# Patient Record
Sex: Female | Born: 1965 | Race: Black or African American | Hispanic: No | Marital: Single | Smoking: Never smoker
Health system: Southern US, Community
[De-identification: ages and names within clinical notes are randomized; demographics above are authoritative.]

---

## 1999-07-31 ENCOUNTER — Emergency Department (HOSPITAL_COMMUNITY): Admission: EM | Admit: 1999-07-31 | Discharge: 1999-07-31 | Payer: Self-pay | Admitting: Emergency Medicine

## 2002-01-08 ENCOUNTER — Emergency Department (HOSPITAL_COMMUNITY): Admission: EM | Admit: 2002-01-08 | Discharge: 2002-01-08 | Payer: Self-pay | Admitting: Emergency Medicine

## 2002-01-08 ENCOUNTER — Encounter: Payer: Self-pay | Admitting: Emergency Medicine

## 2002-03-15 ENCOUNTER — Emergency Department (HOSPITAL_COMMUNITY): Admission: EM | Admit: 2002-03-15 | Discharge: 2002-03-15 | Payer: Self-pay | Admitting: Emergency Medicine

## 2003-04-30 ENCOUNTER — Emergency Department (HOSPITAL_COMMUNITY): Admission: EM | Admit: 2003-04-30 | Discharge: 2003-04-30 | Payer: Self-pay | Admitting: Emergency Medicine

## 2013-11-17 ENCOUNTER — Emergency Department (HOSPITAL_COMMUNITY)
Admission: EM | Admit: 2013-11-17 | Discharge: 2013-11-17 | Disposition: A | Discharge status: Home / Self Care | Payer: Self-pay | Attending: Emergency Medicine | Admitting: Emergency Medicine

## 2013-11-17 ENCOUNTER — Encounter (HOSPITAL_COMMUNITY): Payer: Self-pay | Admitting: Emergency Medicine

## 2013-11-17 DIAGNOSIS — S39012A Strain of muscle, fascia and tendon of lower back, initial encounter: Secondary | ICD-10-CM

## 2013-11-17 DIAGNOSIS — M659 Synovitis and tenosynovitis, unspecified: Secondary | ICD-10-CM | POA: Insufficient documentation

## 2013-11-17 DIAGNOSIS — M7750 Other enthesopathy of unspecified foot: Secondary | ICD-10-CM

## 2013-11-17 DIAGNOSIS — Y939 Activity, unspecified: Secondary | ICD-10-CM | POA: Insufficient documentation

## 2013-11-17 DIAGNOSIS — S335XXA Sprain of ligaments of lumbar spine, initial encounter: Secondary | ICD-10-CM | POA: Insufficient documentation

## 2013-11-17 DIAGNOSIS — X58XXXA Exposure to other specified factors, initial encounter: Secondary | ICD-10-CM | POA: Insufficient documentation

## 2013-11-17 DIAGNOSIS — M65979 Unspecified synovitis and tenosynovitis, unspecified ankle and foot: Secondary | ICD-10-CM | POA: Insufficient documentation

## 2013-11-17 DIAGNOSIS — Y929 Unspecified place or not applicable: Secondary | ICD-10-CM | POA: Insufficient documentation

## 2013-11-17 MED ORDER — HYDROCODONE-ACETAMINOPHEN 5-325 MG PO TABS: 1.0000 | ORAL_TABLET | ORAL | Status: DC | PRN

## 2013-11-17 MED ORDER — BACLOFEN 10 MG PO TABS: ORAL_TABLET | ORAL | Status: DC

## 2013-11-17 MED ORDER — MELOXICAM 7.5 MG PO TABS: ORAL_TABLET | ORAL | Status: DC

## 2013-11-17 NOTE — ED Provider Notes (Signed)
CSN: [id number]     Arrival date & time 11/17/13  1740 History   First MD Initiated Contact with Patient 11/17/13 1812     Chief Complaint  Patient presents with  . Foot Pain  . Back Pain   (Consider location/radiation/quality/duration/timing/severity/associated sxs/prior Treatment) HPI Comments: The patient is a 47 year old female who presents to the emergency department with complaint of pain at the" top of the left foot" and pain of the right lower back.  The patient states that she has had problems with her foot for a little over 2 months. Within the last week and she is having more and more pain involving the top of her left foot. The patient states that when she first awakens in the morning there is no pain but as she walks around at her job she has more and more pain. By the in the day she states that she can't stand to put weight or flex her left foot. He's not had any recent injury or trauma to the left foot. His been no previous operations or procedures involving the left foot. The patient states she does a lot of walking and standing and she works in [employer] . She has tried changing shoes but this has not helped. She denies any numbness or tingling of the toes, and been no changes in temperature or or color of the left foot.  The patient also complains of soreness of her right lower back. She states that this is worse when she lays in certain positions or makes certain movements. She has not had any urinary symptoms. She has no history of kidney stones. There's been no injury or trauma to the right flank area.  Patient is a 47 y.o. female presenting with lower extremity pain and back pain. The history is provided by the patient.  Foot Pain Associated symptoms include arthralgias. Pertinent negatives include no abdominal pain, chest pain, coughing or neck pain.  Back Pain Associated symptoms: no abdominal pain, no chest pain and no dysuria     History reviewed. No pertinent past  medical history. History reviewed. No pertinent past surgical history. No family history on file. History  Substance Use Topics  . Smoking status: Never Smoker   . Smokeless tobacco: Not on file  . Alcohol Use: No   OB History   Grav Para Term Preterm Abortions TAB SAB Ect Mult Living                 Review of Systems  Constitutional: Negative for activity change.       All ROS Neg except as noted in HPI  HENT: Negative for nosebleeds.   Eyes: Negative for photophobia and discharge.  Respiratory: Negative for cough, shortness of breath and wheezing.   Cardiovascular: Negative for chest pain and palpitations.  Gastrointestinal: Negative for abdominal pain and blood in stool.  Genitourinary: Negative for dysuria, frequency and hematuria.  Musculoskeletal: Positive for arthralgias and back pain. Negative for neck pain.       Foot pain  Skin: Negative.   Neurological: Negative for dizziness, seizures and speech difficulty.  Psychiatric/Behavioral: Negative for hallucinations and confusion.    Allergies  Review of patient's allergies indicates no known allergies.  Home Medications  No current outpatient prescriptions on file. BP 143/88  Pulse 89  Temp(Src) 98.3 F (36.8 C) (Oral)  Resp 18  Ht 5\' 6"  (1.676 m)  Wt 172 lb (78.019 kg)  BMI 27.77 kg/m2  SpO2 96%  LMP 10/13/2013 Physical  Exam  Nursing note and vitals reviewed. Constitutional: She is oriented to person, place, and time. She appears well-developed and well-nourished.  Non-toxic appearance.  HENT:  Head: Normocephalic.  Right Ear: Tympanic membrane and external ear normal.  Left Ear: Tympanic membrane and external ear normal.  Eyes: EOM and lids are normal. Pupils are equal, round, and reactive to light.  Neck: Normal range of motion. Neck supple. Carotid bruit is not present.  Cardiovascular: Normal rate, regular rhythm, normal heart sounds, intact distal pulses and normal pulses.   Pulmonary/Chest: Breath  sounds normal. No respiratory distress.  Abdominal: Soft. Bowel sounds are normal. There is no tenderness. There is no guarding.  Musculoskeletal: Normal range of motion.  There is pain at the dorsum of the left foot with flexion of the toes. Palpation. There is no pain with flexion and extension of the ankle. The Achilles tendon is intact. The capillary refill is less than 2 seconds. There no lesions noted between the toes.  There is soreness with attempted range of motion of the lower back, right more than left. There are no hot areas appreciated. No palpable step off of the lumbar area.  Lymphadenopathy:       Head (right side): No submandibular adenopathy present.       Head (left side): No submandibular adenopathy present.    She has no cervical adenopathy.  Neurological: She is alert and oriented to person, place, and time. She has normal strength. No cranial nerve deficit or sensory deficit.  Skin: Skin is warm and dry.  Psychiatric: She has a normal mood and affect. Her speech is normal.    ED Course  Procedures (including critical care time) Labs Review Labs Reviewed - No data to display Imaging Review No results found.  EKG Interpretation   None       MDM  No diagnosis found. *I have reviewed nursing notes, vital signs, and all appropriate lab and imaging results for this patient.**  Examination is consistent with tendinitis involving the dorsum of the left foot. There is also mild to moderate muscle strain involving the lower back. The vital signs are well within normal limits. The pulse oximetry is 96% on room air. Within normal limits by my interpretation. There is no history of urinary symptoms, and no history of kidney stones.  The plan at this time is for the patient to be treated with Mobic and Robaxin . Patient is to see orthopedics concerning her foot pain if not improving.  Kathie Dike, PA-C 11/18/13 0003

## 2013-11-17 NOTE — ED Notes (Signed)
Pt c/o pain in top of left foot and r lower back x 2 months.  Reports pain got worse yesterday.  Denies injury.  Reports her job requires a lot of standing.

## 2013-11-18 NOTE — ED Provider Notes (Signed)
Medical screening examination/treatment/procedure(s) were performed by non-physician practitioner and as supervising physician I was immediately available for consultation/collaboration.  EKG Interpretation   None         Glynn Octave, MD 11/18/13 [phone]

## 2014-07-09 ENCOUNTER — Emergency Department (HOSPITAL_COMMUNITY): Payer: Self-pay

## 2014-07-09 ENCOUNTER — Emergency Department (HOSPITAL_COMMUNITY)
Admission: EM | Admit: 2014-07-09 | Discharge: 2014-07-09 | Disposition: A | Discharge status: Home / Self Care | Payer: Self-pay | Attending: Emergency Medicine | Admitting: Emergency Medicine

## 2014-07-09 ENCOUNTER — Encounter (HOSPITAL_COMMUNITY): Payer: Self-pay | Admitting: Emergency Medicine

## 2014-07-09 DIAGNOSIS — Y929 Unspecified place or not applicable: Secondary | ICD-10-CM | POA: Insufficient documentation

## 2014-07-09 DIAGNOSIS — Z79899 Other long term (current) drug therapy: Secondary | ICD-10-CM | POA: Insufficient documentation

## 2014-07-09 DIAGNOSIS — Y9389 Activity, other specified: Secondary | ICD-10-CM | POA: Insufficient documentation

## 2014-07-09 DIAGNOSIS — S61012A Laceration without foreign body of left thumb without damage to nail, initial encounter: Secondary | ICD-10-CM

## 2014-07-09 DIAGNOSIS — Z23 Encounter for immunization: Secondary | ICD-10-CM | POA: Insufficient documentation

## 2014-07-09 DIAGNOSIS — S61209A Unspecified open wound of unspecified finger without damage to nail, initial encounter: Secondary | ICD-10-CM | POA: Insufficient documentation

## 2014-07-09 DIAGNOSIS — W260XXA Contact with knife, initial encounter: Secondary | ICD-10-CM | POA: Insufficient documentation

## 2014-07-09 DIAGNOSIS — W261XXA Contact with sword or dagger, initial encounter: Secondary | ICD-10-CM

## 2014-07-09 MED ORDER — TETANUS-DIPHTH-ACELL PERTUSSIS 5-2.5-18.5 LF-MCG/0.5 IM SUSP
0.5000 mL | Freq: Once | INTRAMUSCULAR | Status: AC
  Administered 2014-07-09: 0.5 mL via INTRAMUSCULAR
  Filled 2014-07-09: qty 0.5

## 2014-07-09 MED ORDER — BACITRACIN-NEOMYCIN-POLYMYXIN 400-5-5000 EX OINT
TOPICAL_OINTMENT | Freq: Once | CUTANEOUS | Status: AC
  Administered 2014-07-09: 1 via TOPICAL
  Filled 2014-07-09: qty 1

## 2014-07-09 MED ORDER — BUPIVACAINE HCL (PF) 0.25 % IJ SOLN
30.0000 mL | Freq: Once | INTRAMUSCULAR | Status: AC
  Administered 2014-07-09: 30 mL
  Filled 2014-07-09: qty 30

## 2014-07-09 MED ORDER — LIDOCAINE HCL (PF) 1 % IJ SOLN
30.0000 mL | Freq: Once | INTRAMUSCULAR | Status: DC
  Filled 2014-07-09: qty 30

## 2014-07-09 MED ORDER — LIDOCAINE HCL (PF) 1 % IJ SOLN
5.0000 mL | Freq: Once | INTRAMUSCULAR | Status: AC
  Administered 2014-07-09: 5 mL

## 2014-07-09 MED ORDER — POVIDONE-IODINE 10 % EX SOLN
CUTANEOUS | Status: AC
  Filled 2014-07-09: qty 118

## 2014-07-09 MED ORDER — LIDOCAINE HCL (PF) 1 % IJ SOLN
INTRAMUSCULAR | Status: AC
  Administered 2014-07-09: 5 mL
  Filled 2014-07-09: qty 5

## 2014-07-09 NOTE — ED Notes (Signed)
Pt c/o laceration to left thumb, pt states that she was cutting up sausage this am with a knife and cut her left thumb instead, pt has laceration noted to pad of left thumb area, bleeding controlled at present,

## 2014-07-09 NOTE — ED Provider Notes (Signed)
CSN: [id number]     Arrival date & time 07/09/14  0911 History   First MD Initiated Contact with Patient 07/09/14 0913     Chief Complaint  Patient presents with  . Laceration     (Consider location/radiation/quality/duration/timing/severity/associated sxs/prior Treatment) Patient is a 48 y.o. female presenting with skin laceration. The history is provided by the patient.  Laceration Location:  Finger Finger laceration location:  L thumb Depth:  Through dermis Bleeding: controlled with pressure   Time since incident:  1 hour Laceration mechanism:  Knife Pain details:    Severity:  Mild   Timing:  Constant   Progression:  Partially resolved Foreign body present:  No foreign bodies Relieved by:  Pressure Tetanus status:  Unknown  Kathryn Fletcher is a 48 y.o. female who presents to the ED with a laceration to the left thumb. She states she was cutting up frozen sausage this morning at work and the knife slipped and cut her thumb. The laceration is at the tip and goes through the nail. She denies any other injuries. Bleeding controlled at this time.   History reviewed. No pertinent past medical history. History reviewed. No pertinent past surgical history. No family history on file. History  Substance Use Topics  . Smoking status: Never Smoker   . Smokeless tobacco: Not on file  . Alcohol Use: No   OB History   Grav Para Term Preterm Abortions TAB SAB Ect Mult Living                 Review of Systems    Allergies  Review of patient's allergies indicates no known allergies.  Home Medications   Prior to Admission medications   Medication Sig Start Date End Date Taking? Authorizing Provider  baclofen (LIORESAL) 10 MG tablet 1 po tid 11/17/13   Kathie Dike, PA-C  HYDROcodone-acetaminophen (NORCO/VICODIN) 5-325 MG per tablet Take 1 tablet by mouth every 4 (four) hours as needed. 11/17/13   Kathie Dike, PA-C  meloxicam (MOBIC) 7.5 MG tablet 1 po bid with food  11/17/13   Kathie Dike, PA-C   BP 150/90  Pulse 82  Temp(Src) 98.2 F (36.8 C) (Oral)  Resp 16  Ht 5\' 6"  (1.676 m)  Wt 172 lb (78.019 kg)  BMI 27.77 kg/m2  SpO2 100%  LMP 07/03/2014 Physical Exam  Nursing note and vitals reviewed. Constitutional: She is oriented to person, place, and time. She appears well-developed and well-nourished. No distress.  HENT:  Head: Normocephalic.  Eyes: EOM are normal.  Neck: Neck supple.  Cardiovascular: Normal rate.   Pulmonary/Chest: Effort normal.  Musculoskeletal: Normal range of motion.       Left hand: She exhibits tenderness and laceration. She exhibits normal range of motion, normal capillary refill and no swelling. Normal sensation noted. Normal strength noted.       Hands: There is a flap laceration to the palmar aspect of the distal aspect of the right thumb. The laceration extends through the distal portion of the nail but does not extend to the nail bed.   Neurological: She is alert and oriented to person, place, and time. No cranial nerve deficit.  Skin: Skin is warm and dry.  Psychiatric: She has a normal mood and affect. Her behavior is normal.   Dg Finger Thumb Left  07/09/2014   CLINICAL DATA:  Laceration to distal aspect of left first finger.  EXAM: LEFT THUMB 2+V  COMPARISON:  None.  FINDINGS: No evidence of acute  fracture, dislocation or soft tissue foreign body. No arthropathy or bony lesions.  IMPRESSION: Normal left first finger.   Electronically Signed   By: Irish LackGlenn  Yamagata M.D.   On: 07/09/2014 10:05    ED Course  Procedures LACERATION REPAIR Performed by: NEESE,HOPE Authorized by: NEESE,HOPE Consent: Verbal consent obtained. Risks and benefits: risks, benefits and alternatives were discussed Consent given by: patient Patient identity confirmed: provided demographic data Prepped and Draped in normal sterile fashion Wound explored  Laceration Location: tip of left thumb, V shaped flap  Laceration Length: 2  cm  No Foreign Bodies seen or palpated  Anesthesia: digital block  Local anesthetic: lidocaine 1% without epinephrine and Sensorcaine 0.25%  Anesthetic total: 4 ml  Irrigation method: syringe Amount of cleaning: standard  Skin closure: 5-0 prolene  Number of sutures: 4 Technique: interrupted  Patient tolerance: Patient tolerated the procedure well with no immediate complications.  MDM  48 y.o. female with laceration to the left thumb that she accidentally cut with a knife at work today. X-ray reviewed,  Tetanus updated, bacitracin ointment and pressure dressing applied. Patient to follow up for suture removal in one week with her doctor or she may return here as needed. She will take tylenol as needed for pain. Stable for discharge without neurovascular deficits.   Hope Orlene OchM Neese, NP 07/09/14 1102  Medical screening examination/treatment/procedure(s) were conducted as a shared visit with non-physician practitioner(s) and myself.  I personally evaluated the patient during the encounter.   EKG Interpretation None     Wound examined. Neurovascular intact. Laceration repair per nurse practitioner  Kathryn HutchingBrian Mertis Mosher, MD 07/10/14 [phone]

## 2014-07-16 ENCOUNTER — Emergency Department (HOSPITAL_COMMUNITY)
Admission: EM | Admit: 2014-07-16 | Discharge: 2014-07-16 | Disposition: A | Discharge status: Home / Self Care | Payer: Self-pay | Attending: Emergency Medicine | Admitting: Emergency Medicine

## 2014-07-16 ENCOUNTER — Encounter (HOSPITAL_COMMUNITY): Payer: Self-pay | Admitting: Emergency Medicine

## 2014-07-16 DIAGNOSIS — Z4802 Encounter for removal of sutures: Secondary | ICD-10-CM | POA: Insufficient documentation

## 2014-07-16 DIAGNOSIS — Z791 Long term (current) use of non-steroidal anti-inflammatories (NSAID): Secondary | ICD-10-CM | POA: Insufficient documentation

## 2014-07-16 NOTE — Discharge Instructions (Signed)
Incision Care ° An incision is a surgical cut to open your skin. You need to take care of your incision. This helps you to not get an infection. °HOME CARE °· Only take medicine as told by your doctor. °· Do not take off your bandage (dressing) or get your incision wet until your doctor approves. Change the bandage and call your doctor if the bandage gets wet, dirty, or starts to smell. °· Take showers. Do not take baths, swim, or do anything that may soak your incision until it heals. °· Return to your normal diet and activities as told or allowed by your doctor. °· Avoid lifting any weight until your doctor approves. °· Put medicine that helps lessen itching on your incision as told by your doctor. Do not pick or scratch at your incision. °· Keep your doctor visit to have your stitches (sutures) or staples removed. °· Drink enough fluids to keep your pee (urine) clear or pale yellow. °GET HELP RIGHT AWAY IF: °· You have a fever. °· You have a rash. °· You are dizzy, or you pass out (faint) while standing. °· You have trouble breathing. °· You have a reaction or side effects to medicine given to you. °· You have redness, puffiness (swelling), or more pain in the incision and medicine does not help. °· You have fluid, blood, or yellowish-white fluid (pus) coming from the incision lasting over 1 day. °· You have muscle aches, chills, or you feel sick. °· You have a bad smell coming from the incision or bandage. °· Your incision opens up after stitches, staples, or sticky strips have been removed. °· You keep feeling sick to your stomach (nauseous) or keep throwing up (vomiting). °MAKE SURE YOU:  °· Understand these instructions. °· Will watch your condition. °· Will get help right away if you are not doing well or get worse. °Document Released: 02/10/2012 Document Reviewed: 01/12/2014 °[employer]® Patient Information ©2015 [employer]. This information is not intended to replace advice given to you by your health  care provider. Make sure you discuss any questions you have with your health care provider. ° ° ° ° °

## 2014-07-16 NOTE — ED Notes (Signed)
Stitches placed last Saturday, back for removal today

## 2014-07-16 NOTE — ED Provider Notes (Signed)
CSN: [id number]     Arrival date & time 07/16/14  2018 History   First MD Initiated Contact with Patient 07/16/14 2025     Chief Complaint  Patient presents with  . Suture / Staple Removal    left tumb     (Consider location/radiation/quality/duration/timing/severity/associated sxs/prior Treatment) HPI  Kathryn Fletcher is a 48 y.o. female presenting for suture removal to left thumb. Patient cut herself with a knife while cutting meat approximately a week ago. She had sutures placed here. Patient denies any pain, discharge, swelling.  History reviewed. No pertinent past medical history. History reviewed. No pertinent past surgical history. History reviewed. No pertinent family history. History  Substance Use Topics  . Smoking status: Never Smoker   . Smokeless tobacco: Not on file  . Alcohol Use: No   OB History   Grav Para Term Preterm Abortions TAB SAB Ect Mult Living                 Review of Systems  10 systems reviewed and found to be negative, except as noted in the HPI.   Allergies  Review of patient's allergies indicates no known allergies.  Home Medications   Prior to Admission medications   Medication Sig Start Date End Date Taking? Authorizing Provider  acetaminophen (TYLENOL) 500 MG tablet Take 1,000 mg by mouth every 6 (six) hours as needed (cramps).    Historical Provider, MD  naproxen sodium (ANAPROX) 220 MG tablet Take 440 mg by mouth 2 (two) times daily with a meal.    Historical Provider, MD   BP 140/80  Pulse 88  Temp(Src) 98.8 F (37.1 C) (Oral)  Resp 20  Ht 5\' 6"  (1.676 m)  Wt 172 lb (78.019 kg)  BMI 27.77 kg/m2  SpO2 100%  LMP 07/03/2014 Physical Exam  Nursing note and vitals reviewed. Constitutional: She is oriented to person, place, and time. She appears well-developed and well-nourished. No distress.  HENT:  Head: Normocephalic.  Eyes: Conjunctivae and EOM are normal.  Cardiovascular: Normal rate.   Pulmonary/Chest: Effort normal.  No stridor.  Musculoskeletal: Normal range of motion.  Neurological: She is alert and oriented to person, place, and time.  Skin:  2 Sutures in place on  pad of left thumb. Clean dry and intact. No tenderness palpation.  Psychiatric: She has a normal mood and affect.    ED Course  SUTURE REMOVAL Date/Time: 07/16/2014 8:34 PM Performed by: Wynetta Emery Authorized by: Wynetta Emery Consent: Verbal consent obtained. Consent given by: patient Patient identity confirmed: verbally with patient Body area: upper extremity Location details: left thumb Wound Appearance: clean Sutures Removed: 2 Facility: sutures placed in this facility Patient tolerance: Patient tolerated the procedure well with no immediate complications.   (including critical care time) Labs Review Labs Reviewed - No data to display  Imaging Review No results found.   EKG Interpretation None      MDM   Final diagnoses:  Visit for suture removal    Filed Vitals:   07/16/14 2023  BP: 140/80  Pulse: 88  Temp: 98.8 F (37.1 C)  TempSrc: Oral  Resp: 20  Height: 5\' 6"  (1.676 m)  Weight: 172 lb (78.019 kg)  SpO2: 100%    Kathryn Fletcher is a 48 y.o. female presenting for suture removal to left thumb. No signs of infection.  Evaluation does not show pathology that would require ongoing emergent intervention or inpatient treatment. Pt is hemodynamically stable and mentating appropriately. Discussed findings and plan  with patient/guardian, who agrees with care plan. All questions answered. Return precautions discussed and outpatient follow up given.    [name], PA-C 07/16/14 2035

## 2014-07-17 NOTE — ED Provider Notes (Signed)
Medical screening examination/treatment/procedure(s) were performed by non-physician practitioner and as supervising physician I was immediately available for consultation/collaboration.    Larita Deremer, MD 07/17/14 0019 

## 2015-04-14 IMAGING — CR DG FINGER THUMB 2+V*L*
1 series · 1 of 1 positions shown · non-contrast
Comparison: None.

CLINICAL DATA: Laceration to distal aspect of left first finger.

EXAM:
LEFT THUMB 2+V

[view not recorded]
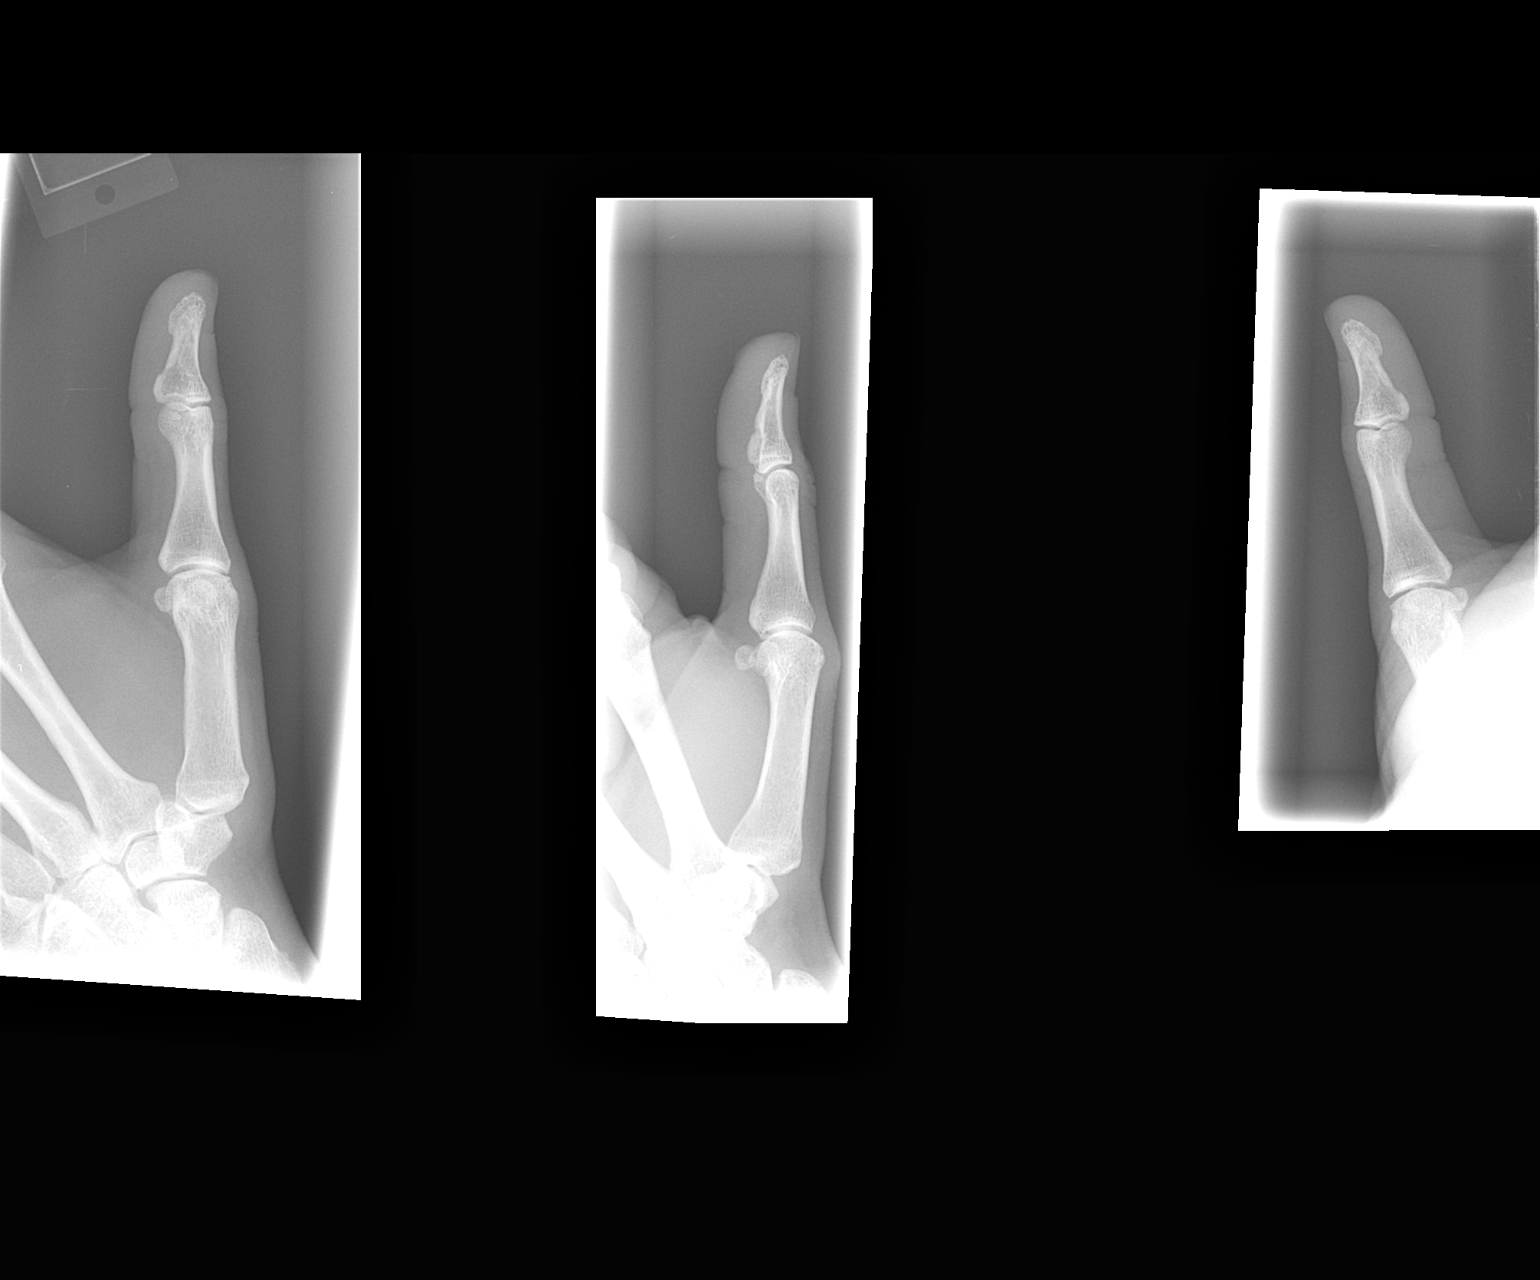

[1 of 1 positions shown; findings below may reference images not displayed]

FINDINGS: No evidence of acute fracture, dislocation or soft tissue foreign
body. No arthropathy or bony lesions.
IMPRESSION: Normal left first finger.

## 2015-07-15 ENCOUNTER — Encounter (HOSPITAL_COMMUNITY): Payer: Self-pay | Admitting: *Deleted

## 2015-07-15 ENCOUNTER — Emergency Department (HOSPITAL_COMMUNITY)
Admission: EM | Admit: 2015-07-15 | Discharge: 2015-07-15 | Disposition: A | Discharge status: Home / Self Care | Payer: Self-pay | Attending: Emergency Medicine | Admitting: Emergency Medicine

## 2015-07-15 DIAGNOSIS — N390 Urinary tract infection, site not specified: Secondary | ICD-10-CM

## 2015-07-15 DIAGNOSIS — Z791 Long term (current) use of non-steroidal anti-inflammatories (NSAID): Secondary | ICD-10-CM | POA: Insufficient documentation

## 2015-07-15 LAB — COMPREHENSIVE METABOLIC PANEL
ALK PHOS: 56 U/L (ref 38–126)
ALT: 11 U/L — ABNORMAL LOW (ref 14–54)
AST: 20 U/L (ref 15–41)
Albumin: 3.5 g/dL (ref 3.5–5.0)
Anion gap: 8 (ref 5–15)
BUN: 13 mg/dL (ref 6–20)
CHLORIDE: 107 mmol/L (ref 101–111)
CO2: 25 mmol/L (ref 22–32)
Calcium: 8.6 mg/dL — ABNORMAL LOW (ref 8.9–10.3)
Creatinine, Ser: 0.88 mg/dL (ref 0.44–1.00)
GFR calc non Af Amer: >60 mL/min (ref >60)
Glucose, Bld: 94 mg/dL (ref 65–99)
Potassium: 3.9 mmol/L (ref 3.5–5.1)
Sodium: 140 mmol/L (ref 135–145)
Total Bilirubin: 0.9 mg/dL (ref 0.3–1.2)
Total Protein: 7.4 g/dL (ref 6.5–8.1)

## 2015-07-15 LAB — RETICULOCYTES
RBC.: 4.31 MIL/uL (ref 3.87–5.11)
Retic Count, Absolute: 43.1 10*3/uL (ref 19.0–186.0)
Retic Ct Pct: 1 % (ref 0.4–3.1)

## 2015-07-15 LAB — CBC WITH DIFFERENTIAL/PLATELET
Basophils Absolute: 0 10*3/uL (ref 0.0–0.1)
Basophils Relative: 0 % (ref 0–1)
EOS PCT: 0 % (ref 0–5)
Eosinophils Absolute: 0 10*3/uL (ref 0.0–0.7)
HEMATOCRIT: 29.5 % — AB (ref 36.0–46.0)
Hemoglobin: 8.6 g/dL — ABNORMAL LOW (ref 12.0–15.0)
Lymphocytes Relative: 13 % (ref 12–46)
Lymphs Abs: 0.9 10*3/uL (ref 0.7–4.0)
MCH: 20 pg — AB (ref 26.0–34.0)
MCHC: 29.2 g/dL — ABNORMAL LOW (ref 30.0–36.0)
MCV: 68.8 fL — ABNORMAL LOW (ref 78.0–100.0)
Monocytes Absolute: 0.6 10*3/uL (ref 0.1–1.0)
Monocytes Relative: 9 % (ref 3–12)
NEUTROS ABS: 5.3 10*3/uL (ref 1.7–7.7)
Neutrophils Relative %: 78 % — ABNORMAL HIGH (ref 43–77)
Platelets: 320 10*3/uL (ref 150–400)
RBC: 4.29 MIL/uL (ref 3.87–5.11)
RDW: 18.6 % — ABNORMAL HIGH (ref 11.5–15.5)
WBC: 6.8 10*3/uL (ref 4.0–10.5)

## 2015-07-15 LAB — URINALYSIS, ROUTINE W REFLEX MICROSCOPIC
Glucose, UA: NEGATIVE mg/dL
HGB URINE DIPSTICK: NEGATIVE
KETONES UR: 40 mg/dL — AB
LEUKOCYTES UA: NEGATIVE
NITRITE: NEGATIVE
PH: 5.5 (ref 5.0–8.0)
Protein, ur: 30 mg/dL — AB
Urobilinogen, UA: 1 mg/dL (ref 0.0–1.0)

## 2015-07-15 LAB — URINE MICROSCOPIC-ADD ON

## 2015-07-15 LAB — IRON AND TIBC
Iron: 7 ug/dL — ABNORMAL LOW (ref 28–170)
Saturation Ratios: 2 % — ABNORMAL LOW (ref 10.4–31.8)
TIBC: 423 ug/dL (ref 250–450)
UIBC: 416 ug/dL

## 2015-07-15 LAB — FERRITIN: Ferritin: 14 ng/mL (ref 11–307)

## 2015-07-15 LAB — FOLATE: Folate: 16.3 ng/mL (ref >5.9)

## 2015-07-15 LAB — VITAMIN B12: Vitamin B-12: 125 pg/mL — ABNORMAL LOW (ref 180–914)

## 2015-07-15 MED ORDER — CEPHALEXIN 500 MG PO CAPS: 500.0000 mg | ORAL_CAPSULE | Freq: Four times a day (QID) | ORAL | Status: DC

## 2015-07-15 MED ORDER — NAPROXEN 500 MG PO TABS: ORAL_TABLET | ORAL | Status: DC

## 2015-07-15 NOTE — ED Provider Notes (Addendum)
CSN: [id number]     Arrival date & time 07/15/15  1048 History  This chart was scribed for Bethann Berkshire, MD by Ronney Lion, ED Scribe. This patient was seen in room APA03/[facility]03 and the patient's care was started at 11:35 AM.      Chief Complaint  Patient presents with  . Abdominal Pain   Patient is a 49 y.o. female presenting with abdominal pain. The history is provided by the patient. No language interpreter was used.  Abdominal Pain Pain location:  RLQ Pain quality: aching   Pain radiates to:  Does not radiate Pain severity:  Moderate Onset quality:  Gradual Duration:  1 day Timing:  Constant Chronicity:  New Relieved by:  None tried Worsened by:  Nothing tried Ineffective treatments:  None tried Associated symptoms: no chest pain, no chills, no cough, no diarrhea, no fatigue, no fever and no hematuria     HPI Comments: Kathryn Fletcher is a 49 y.o. female who presents to the Emergency Department complaining of RLQ pain that began yesterday. She states it hurts even when she doesn't push on it. Patient denies a history of any abdominal surgeries. She also denies fever or chills.  History reviewed. No pertinent past medical history. History reviewed. No pertinent past surgical history. No family history on file. Social History  Substance Use Topics  . Smoking status: Never Smoker   . Smokeless tobacco: None  . Alcohol Use: No   OB History    No data available     Review of Systems  Constitutional: Negative for fever, chills, appetite change and fatigue.  HENT: Negative for congestion, ear discharge and sinus pressure.   Eyes: Negative for discharge.  Respiratory: Negative for cough.   Cardiovascular: Negative for chest pain.  Gastrointestinal: Positive for abdominal pain. Negative for diarrhea.  Genitourinary: Negative for frequency and hematuria.  Musculoskeletal: Negative for back pain.  Skin: Negative for rash.  Neurological: Negative for seizures and headaches.   Psychiatric/Behavioral: Negative for hallucinations.   Allergies  Review of patient's allergies indicates no known allergies.  Home Medications   Prior to Admission medications   Medication Sig Start Date End Date Taking? Authorizing Provider  acetaminophen (TYLENOL) 500 MG tablet Take 1,000 mg by mouth every 6 (six) hours as needed (cramps).    Historical Provider, MD  naproxen sodium (ANAPROX) 220 MG tablet Take 440 mg by mouth 2 (two) times daily with a meal.    Historical Provider, MD   BP 120/62 mmHg  Pulse 105  Temp(Src) 99.3 F (37.4 C) (Oral)  Resp 18  Ht  (1.676 m)  Wt 165 lb (74.844 kg)  BMI 26.64 kg/m2  SpO2 100%  LMP 07/02/2015 Physical Exam  Constitutional: She is oriented to person, place, and time. She appears well-developed.  HENT:  Head: Normocephalic.  Eyes: Conjunctivae and EOM are normal. No scleral icterus.  Neck: Neck supple. No thyromegaly present.  Cardiovascular: Normal rate and regular rhythm.  Exam reveals no gallop and no friction rub.   No murmur heard. Pulmonary/Chest: No stridor. She has no wheezes. She has no rales. She exhibits no tenderness.  Abdominal: She exhibits no distension. There is tenderness. There is no rebound.  Mild RLQ tenderness.  Musculoskeletal: Normal range of motion. She exhibits no edema.  Lymphadenopathy:    She has no cervical adenopathy.  Neurological: She is oriented to person, place, and time. She exhibits normal muscle tone. Coordination normal.  Skin: No rash noted. No erythema.  Psychiatric:  She has a normal mood and affect. Her behavior is normal.  Nursing note and vitals reviewed.   ED Course  Procedures (including critical care time)  DIAGNOSTIC STUDIES: Oxygen Saturation is 100% on RA, normal by my interpretation.    COORDINATION OF CARE: 11:38 AM - Discussed treatment plan with pt at bedside which includes diagnostic tests. Pt verbalized understanding and agreed to plan.   Labs Review Labs  Reviewed  CBC WITH DIFFERENTIAL/PLATELET  COMPREHENSIVE METABOLIC PANEL  URINALYSIS, ROUTINE W REFLEX MICROSCOPIC (NOT AT [facility])    Imaging Review No results found.   EKG Interpretation None      MDM   Final diagnoses:  None    Abdominal pain,  Probable uti,  Doubt appdx.   Pt also anemic with heavy periods.  Pt given keflex for uti and referred to gyn for follow up of uti, anemia and heavy menses  The chart was scribed for me under my direct supervision.  I personally performed the history, physical, and medical decision making and all procedures in the evaluation of this patient.Bethann Berkshire, MD 07/15/15 1356  Bethann Berkshire, MD 07/15/15 1356

## 2015-07-15 NOTE — Discharge Instructions (Signed)
Follow up with dr. Emelda Fear or another md in 1-2 weeks for recheck of infection and low blood count

## 2015-07-15 NOTE — ED Notes (Signed)
Pt states pain to right lower abdomen. States swelling noted also. Denies N/V/D.

## 2015-07-17 LAB — URINE CULTURE: Special Requests: NORMAL

## 2017-05-06 DIAGNOSIS — H6123 Impacted cerumen, bilateral: Secondary | ICD-10-CM | POA: Insufficient documentation

## 2017-05-07 ENCOUNTER — Emergency Department (HOSPITAL_COMMUNITY)
Admission: EM | Admit: 2017-05-07 | Discharge: 2017-05-07 | Disposition: A | Discharge status: Home / Self Care | Payer: Self-pay | Attending: Emergency Medicine | Admitting: Emergency Medicine

## 2017-05-07 ENCOUNTER — Encounter (HOSPITAL_COMMUNITY): Payer: Self-pay | Admitting: *Deleted

## 2017-05-07 DIAGNOSIS — H6123 Impacted cerumen, bilateral: Secondary | ICD-10-CM

## 2017-05-07 MED ORDER — CARBAMIDE PEROXIDE 6.5 % OT SOLN: 5.0000 [drp] | Freq: Two times a day (BID) | OTIC | 0 refills | Status: DC

## 2017-05-07 MED ORDER — CARBAMIDE PEROXIDE 6.5 % OT SOLN
5.0000 [drp] | Freq: Once | OTIC | Status: AC
  Administered 2017-05-07: 5 [drp] via OTIC
  Filled 2017-05-07: qty 15

## 2017-05-07 NOTE — ED Provider Notes (Signed)
[facility]EM[facility]Y DEPT Provider Note   CSN: [id number] Arrival date & time: 05/06/17  2329     History   Chief Complaint Chief Complaint  Patient presents with  . Otalgia    HPI Kathryn Fletcher is a 51 y.o. female.  Patient presents with 1 week of bilateral ear fullness and pressure. Denies any pain or drainage. Denies any bleeding. Denies any headache, fever, sore throat, nausea vomiting, rhinorrhea. Good By mouth intake and urine output. No chest pain or shortness of breath.   The history is provided by the patient.  Otalgia  Pertinent negatives include no ear discharge, no abdominal pain, no vomiting and no cough.    History reviewed. No pertinent past medical history.  There are no active problems to display for this patient.   History reviewed. No pertinent surgical history.  OB History    No data available       Home Medications    Prior to Admission medications   Not on File    Family History History reviewed. No pertinent family history.  Social History Social History  Substance Use Topics  . Smoking status: Never Smoker  . Smokeless tobacco: Never Used  . Alcohol use No     Allergies   Patient has no known allergies.   Review of Systems Review of Systems  Constitutional: Negative for activity change, appetite change and fever.  HENT: Positive for ear pain. Negative for ear discharge.   Respiratory: Negative for cough and shortness of breath.   Cardiovascular: Negative for chest pain.  Gastrointestinal: Negative for abdominal pain, nausea and vomiting.  Genitourinary: Negative for dysuria and hematuria.  Musculoskeletal: Negative for arthralgias, back pain and myalgias.  Neurological: Negative for dizziness, facial asymmetry and weakness.    all other systems are negative except as noted in the HPI and PMH.    Physical Exam Updated Vital Signs BP (!) 153/77 (BP Location: Right Arm)   Pulse 78   Temp 97.8 F (36.6 C) (Oral)   Resp 18    Ht 5\' 6"  (1.676 m)   Wt 73.5 kg (162 lb)   LMP 07/02/2015   SpO2 100%   BMI 26.15 kg/m   Physical Exam  Constitutional: She is oriented to person, place, and time. She appears well-developed and well-nourished. No distress.  HENT:  Head: Normocephalic and atraumatic.  Right Ear: External ear normal.  Left Ear: External ear normal.  Mouth/Throat: Oropharynx is clear and moist. No oropharyngeal exudate.  Cerumen impaction bilaterally. No tragus or mastoid pain.  Eyes: Conjunctivae and EOM are normal. Pupils are equal, round, and reactive to light.  Neck: Normal range of motion. Neck supple.  No meningismus.  Cardiovascular: Normal rate, regular rhythm, normal heart sounds and intact distal pulses.   No murmur heard. Pulmonary/Chest: Effort normal and breath sounds normal. No respiratory distress.  Abdominal: Soft. There is no tenderness. There is no rebound and no guarding.  Musculoskeletal: Normal range of motion. She exhibits no edema or tenderness.  Neurological: She is alert and oriented to person, place, and time. No cranial nerve deficit. She exhibits normal muscle tone. Coordination normal.   5/5 strength throughout. CN 2-12 intact.Equal grip strength.   Skin: Skin is warm.  Psychiatric: She has a normal mood and affect. Her behavior is normal.  Nursing note and vitals reviewed.    ED Treatments / Results  Labs (all labs ordered are listed, but only abnormal results are displayed) Labs Reviewed - No data to display  EKG  EKG Interpretation None       Radiology No results found.  Procedures Procedures (including critical care time)  Medications Ordered in ED Medications  carbamide peroxide (DEBROX) 6.5 % OTIC (EAR) solution 5 drop (not administered)     Initial Impression / Assessment and Plan / ED Course  I have reviewed the triage vital signs and the nursing notes.  Pertinent labs & imaging results that were available during my care of the patient  were reviewed by me and considered in my medical decision making (see chart for details).     Patient well-appearing, bilateral cerumen impaction. No tragus or mastoid pain.  Ear wax removal performed with irrigation by nursing staff. Improvement noted. No tympanic membrane rupture noted.  Patient will be given Debrox eardrops, ENT follow-up, return precautions discussed Final Clinical Impressions(s) / ED Diagnoses   Final diagnoses:  Bilateral impacted cerumen    New Prescriptions New Prescriptions   No medications on file     Glynn Octaveancour, Juletta Berhe, MD 05/07/17 [phone]

## 2017-05-07 NOTE — ED Triage Notes (Signed)
Pt c/o fullness and pressure to bilateral ears x 1 week; pt denies any drainage

## 2017-05-07 NOTE — ED Notes (Signed)
Pt ambulatory to waiting room. Pt verbalized understanding of discharge instructions.   

## 2017-11-26 ENCOUNTER — Emergency Department (HOSPITAL_COMMUNITY)
Admission: EM | Admit: 2017-11-26 | Discharge: 2017-11-26 | Disposition: A | Discharge status: Home / Self Care | Payer: Self-pay | Attending: Emergency Medicine | Admitting: Emergency Medicine

## 2017-11-26 ENCOUNTER — Other Ambulatory Visit: Payer: Self-pay

## 2017-11-26 ENCOUNTER — Emergency Department (HOSPITAL_COMMUNITY)
Admission: EM | Admit: 2017-11-26 | Discharge: 2017-11-27 | Disposition: A | Discharge status: Home / Self Care | Payer: Self-pay | Attending: Emergency Medicine | Admitting: Emergency Medicine

## 2017-11-26 ENCOUNTER — Encounter (HOSPITAL_COMMUNITY): Payer: Self-pay

## 2017-11-26 ENCOUNTER — Encounter (HOSPITAL_COMMUNITY): Payer: Self-pay | Admitting: Emergency Medicine

## 2017-11-26 DIAGNOSIS — Z79899 Other long term (current) drug therapy: Secondary | ICD-10-CM | POA: Insufficient documentation

## 2017-11-26 DIAGNOSIS — H6123 Impacted cerumen, bilateral: Secondary | ICD-10-CM | POA: Insufficient documentation

## 2017-11-26 DIAGNOSIS — H6122 Impacted cerumen, left ear: Secondary | ICD-10-CM | POA: Insufficient documentation

## 2017-11-26 MED ORDER — CARBAMIDE PEROXIDE 6.5 % OT SOLN
5.0000 [drp] | Freq: Once | OTIC | Status: AC
  Administered 2017-11-26: 5 [drp] via OTIC
  Filled 2017-11-26: qty 15

## 2017-11-26 NOTE — ED Notes (Signed)
Pt ear irrigated with success. Pt waiting on re-evaluation. Pain is better and able to hear.

## 2017-11-26 NOTE — ED Triage Notes (Signed)
I was here earlier and they flushed out my left ear and it is stopped up again.

## 2017-11-26 NOTE — Discharge Instructions (Signed)
Apply 4-5 drops of the Debrox 3 times a day for 3-4 days for earwax buildup, you may also apply 2-3 drops of hydrogen peroxide to ear intermittently to help control wax buildup.  Do not use q-tips down inside your ears.

## 2017-11-26 NOTE — ED Provider Notes (Signed)
[facility] Provider Note   CSN: [id number] Arrival date & time: 11/26/17  0820     History   Chief Complaint Chief Complaint  Patient presents with  . Ear Fullness    HPI Kathryn Fletcher is a 51 y.o. female.  HPI  Kathryn Fletcher is a 51 y.o. female who presents to the Emergency Department complaining of fullness to both ears for three days.  Has hx of cerumen impaction and states her symptoms feel similar to previous episodes.  She endorses some difficulty hearing, but denies headache, dizziness, ear, jaw or neck pain.  She tried using Debrox last night without relief.  Nothing makes her symptoms better or worse.    History reviewed. No pertinent past medical history.  There are no active problems to display for this patient.   History reviewed. No pertinent surgical history.  OB History    No data available       Home Medications    Prior to Admission medications   Medication Sig Start Date End Date Taking? Authorizing Provider  carbamide peroxide (DEBROX) 6.5 % OTIC solution Place 5 drops into both ears 2 (two) times daily. 05/07/17   Glynn Octaveancour, Stephen, MD    Family History History reviewed. No pertinent family history.  Social History Social History   Tobacco Use  . Smoking status: Never Smoker  . Smokeless tobacco: Never Used  Substance Use Topics  . Alcohol use: No  . Drug use: No     Allergies   Patient has no known allergies.   Review of Systems Review of Systems  Constitutional: Negative for chills and fever.  HENT: Positive for hearing loss. Negative for congestion, ear pain, facial swelling, sinus pressure, sinus pain, sore throat and trouble swallowing.   Respiratory: Negative for cough.   Cardiovascular: Negative for chest pain.  Gastrointestinal: Negative for nausea and vomiting.  Musculoskeletal: Negative for myalgias.  Skin: Negative for rash.  Neurological: Negative for dizziness, speech difficulty, weakness,  numbness and headaches.  Psychiatric/Behavioral: Negative for confusion.     Physical Exam Updated Vital Signs BP (!) 135/92 (BP Location: Right Arm)   Pulse 70   Temp 97.8 F (36.6 C) (Oral)   Resp 16   Ht 5\' 6"  (1.676 m)   Wt 73.5 kg (162 lb)   LMP 07/02/2015   SpO2 97%   BMI 26.15 kg/m   Physical Exam  Constitutional: She is oriented to person, place, and time. She appears well-developed and well-nourished. No distress.  HENT:  Head: Normocephalic and atraumatic.  Mouth/Throat: Uvula is midline, oropharynx is clear and moist and mucous membranes are normal. No uvula swelling. No oropharyngeal exudate.  Cerumen to bilateral auditory canals.  Unable to visualize TMs  Neck: Normal range of motion. Neck supple.  Cardiovascular: Normal rate, regular rhythm and intact distal pulses.  No murmur heard. Pulmonary/Chest: Effort normal and breath sounds normal. No stridor. No respiratory distress.  Lymphadenopathy:    She has no cervical adenopathy.  Neurological: She is alert and oriented to person, place, and time. No sensory deficit. Coordination normal.  Skin: Skin is warm and dry. No rash noted.  Psychiatric: She has a normal mood and affect. Thought content normal.  Nursing note and vitals reviewed.    ED Treatments / Results  Labs (all labs ordered are listed, but only abnormal results are displayed) Labs Reviewed - No data to display  EKG  EKG Interpretation None       Radiology No  results found.  Procedures Procedures (including critical care time)  Medications Ordered in ED Medications  carbamide peroxide (DEBROX) 6.5 % OTIC (EAR) solution 5 drop (not administered)     Initial Impression / Assessment and Plan / ED Course  I have reviewed the triage vital signs and the nursing notes.  Pertinent labs & imaging results that were available during my care of the patient were reviewed by me and considered in my medical decision making (see chart for  details).    Debrox applied and both ear canals irrigated by nursing using saline and hydrogen peroxide.   On recheck, right TM is now visualized and nml appearing.  pt reports feeling better.  Some cerumen still present on left, but improved and TM now partially visualized and nml appearing.    Stable for d/c home, debrox soln dispensed with instructions for home use.    Final Clinical Impressions(s) / ED Diagnoses   Final diagnoses:  Bilateral impacted cerumen    ED Discharge Orders    None       Pauline Ausriplett, Torien Ramroop, PA-C 11/27/17 1400    Mesner, Barbara CowerJason, MD 11/27/17 1524

## 2017-11-26 NOTE — ED Provider Notes (Signed)
[facility] Provider Note   CSN: [id number] Arrival date & time: 11/26/17  2029     History   Chief Complaint Chief Complaint  Patient presents with  . Ear Fullness    HPI Kathryn Fletcher is a 51 y.o. female.  The history is provided by the patient.  She was in the emergency department this morning with her ears plugged with wax.  She had her ears irrigated and her right ear is doing fine.  However, her left ear started feeling plugged up shortly after leaving.  She is not complaining of any pain, but does have decreased hearing in that ear.  She denies fever or chills.  History reviewed. No pertinent past medical history.  There are no active problems to display for this patient.   History reviewed. No pertinent surgical history.  OB History    No data available       Home Medications    Prior to Admission medications   Medication Sig Start Date End Date Taking? Authorizing Provider  carbamide peroxide (DEBROX) 6.5 % OTIC solution Place 5 drops into both ears 2 (two) times daily. 05/07/17   Glynn Octaveancour, Stephen, MD    Family History No family history on file.  Social History Social History   Tobacco Use  . Smoking status: Never Smoker  . Smokeless tobacco: Never Used  Substance Use Topics  . Alcohol use: No  . Drug use: No     Allergies   Patient has no known allergies.   Review of Systems Review of Systems  All other systems reviewed and are negative.    Physical Exam Updated Vital Signs BP (!) 154/82 (BP Location: Right Arm)   Pulse 79   Temp 98.3 F (36.8 C) (Oral)   Resp 16   Ht 5\' 6"  (1.676 m)   Wt 73.5 kg (162 lb)   LMP 07/02/2015   SpO2 98%   BMI 26.15 kg/m   Physical Exam  Nursing note and vitals reviewed.  51 year old female, resting comfortably and in no acute distress. Vital signs are significant for hypertension. Oxygen saturation is 98%, which is normal. Head is normocephalic and atraumatic. PERRLA,  EOMI. Oropharynx is clear.  Right tympanic membrane is clear.  Left tympanic membrane is obscured by soft cerumen. Neck is nontender and supple without adenopathy or JVD. Back is nontender and there is no CVA tenderness. Lungs are clear without rales, wheezes, or rhonchi. Chest is nontender. Heart has regular rate and rhythm without murmur. Abdomen is soft, flat, nontender without masses or hepatosplenomegaly and peristalsis is normoactive. Extremities have no cyanosis or edema, full range of motion is present. Skin is warm and dry without rash. Neurologic: Mental status is normal, cranial nerves are intact, there are no motor or sensory deficits.  ED Treatments / Results   Procedures Procedures (including critical care time)  Medications Ordered in ED Medications - No data to display   Initial Impression / Assessment and Plan / ED Course  I have reviewed the triage vital signs and the nursing notes.  Cerumen impaction of left ear.  Old records are reviewed confirming ED visit earlier today for cerumen impaction, and also ED visit 6 months ago for cerumen impaction.  Left ear is irrigated.  RN reports successful disimpaction.  Patient then left the ED before I could come to talk with her.  She reportedly had felt much better following ear irrigation.  Final Clinical Impressions(s) / ED Diagnoses  Final diagnoses:  Impacted cerumen of left ear    ED Discharge Orders    None       Dione BoozeGlick, Baylee Campus, MD 11/27/17 [phone]

## 2017-11-26 NOTE — ED Triage Notes (Signed)
Stopped up ear per pt since Sunday. Denies pain. States has history of same in past.

## 2017-11-27 NOTE — ED Notes (Signed)
Upon entering room for hourly rounding- pt was not in room- belongings were gone. Steward DroneBrenda, charge nurse and Dr Preston FleetingGlick made aware.

## 2018-07-12 ENCOUNTER — Emergency Department (HOSPITAL_COMMUNITY)
Admission: EM | Admit: 2018-07-12 | Discharge: 2018-07-12 | Disposition: A | Discharge status: Home / Self Care | Payer: Self-pay | Attending: Emergency Medicine | Admitting: Emergency Medicine

## 2018-07-12 ENCOUNTER — Encounter (HOSPITAL_COMMUNITY): Payer: Self-pay | Admitting: Emergency Medicine

## 2018-07-12 ENCOUNTER — Other Ambulatory Visit: Payer: Self-pay

## 2018-07-12 DIAGNOSIS — K0889 Other specified disorders of teeth and supporting structures: Secondary | ICD-10-CM

## 2018-07-12 DIAGNOSIS — K029 Dental caries, unspecified: Secondary | ICD-10-CM | POA: Insufficient documentation

## 2018-07-12 MED ORDER — ONDANSETRON HCL 4 MG PO TABS
4.0000 mg | ORAL_TABLET | Freq: Once | ORAL | Status: AC
  Administered 2018-07-12: 4 mg via ORAL
  Filled 2018-07-12: qty 1

## 2018-07-12 MED ORDER — ACETAMINOPHEN 500 MG PO TABS
1000.0000 mg | ORAL_TABLET | Freq: Once | ORAL | Status: AC
  Administered 2018-07-12: 1000 mg via ORAL
  Filled 2018-07-12: qty 2

## 2018-07-12 MED ORDER — AMOXICILLIN 500 MG PO CAPS: 500.0000 mg | ORAL_CAPSULE | Freq: Three times a day (TID) | ORAL | 0 refills | Status: DC

## 2018-07-12 MED ORDER — IBUPROFEN 800 MG PO TABS
800.0000 mg | ORAL_TABLET | Freq: Once | ORAL | Status: AC
  Administered 2018-07-12: 800 mg via ORAL
  Filled 2018-07-12: qty 1

## 2018-07-12 MED ORDER — AMOXICILLIN 250 MG PO CAPS
500.0000 mg | ORAL_CAPSULE | Freq: Once | ORAL | Status: AC
Start: 2018-07-12 — End: 2018-07-12
  Administered 2018-07-12: 500 mg via ORAL
  Filled 2018-07-12: qty 2

## 2018-07-12 MED ORDER — IBUPROFEN 600 MG PO TABS: 600.0000 mg | ORAL_TABLET | Freq: Four times a day (QID) | ORAL | 0 refills | Status: DC

## 2018-07-12 MED ORDER — TRAMADOL HCL 50 MG PO TABS: ORAL_TABLET | ORAL | 0 refills | Status: DC

## 2018-07-12 NOTE — Discharge Instructions (Addendum)
It is important that you see a dentist as soon as possible. Use amoxil three times daily with a meal. Use Ibuprofen with breakfast, lunch, dinner, and bedtime. Use ultram for more severe pain. This medication may cause drowsiness. Please do not drink, drive, or participate in activity that requires concentration while taking this medication.

## 2018-07-12 NOTE — ED Provider Notes (Signed)
[facility] Provider Note   CSN: [id number] Arrival date & time: 07/12/18  2208     History   Chief Complaint Chief Complaint  Patient presents with  . Dental Pain    HPI Kathryn Fletcher is a 52 y.o. female.  Patient is a 52 year old female who presents to the emergency [facility] with a complaint of toothache.  Patient states she has had right jaw area pain for a week.  This problem is getting progressively worse and causing her right face to hurt.  She has been taking some ibuprofen for the pain, but she says this is really not helping very much.  She has not had any fever or chills to be reported.  She has not had any swelling in her mouth other than around her gum and around the affected tooth.  She presents now for assistance because the pain is getting worse and will not be controlled by over-the-counter medications.  The history is provided by the patient.    History reviewed. No pertinent past medical history.  There are no active problems to display for this patient.   History reviewed. No pertinent surgical history.   OB History   None      Home Medications    Prior to Admission medications   Medication Sig Start Date End Date Taking? Authorizing Provider  carbamide peroxide (DEBROX) 6.5 % OTIC solution Place 5 drops into both ears 2 (two) times daily. 05/07/17   Glynn Octaveancour, Stephen, MD    Family History History reviewed. No pertinent family history.  Social History Social History   Tobacco Use  . Smoking status: Never Smoker  . Smokeless tobacco: Never Used  Substance Use Topics  . Alcohol use: No  . Drug use: No     Allergies   Patient has no known allergies.   Review of Systems Review of Systems  Constitutional: Negative for activity change, chills and fever.       All ROS Neg except as noted in HPI  HENT: Positive for dental problem. Negative for nosebleeds.   Eyes: Negative for photophobia and discharge.    Respiratory: Negative for cough, shortness of breath and wheezing.   Cardiovascular: Negative for chest pain and palpitations.  Gastrointestinal: Positive for vomiting. Negative for abdominal pain, blood in stool and diarrhea.  Genitourinary: Negative for dysuria, frequency and hematuria.  Musculoskeletal: Negative for arthralgias, back pain and neck pain.  Skin: Negative.   Neurological: Negative for dizziness, seizures and speech difficulty.  Psychiatric/Behavioral: Negative for confusion and hallucinations.     Physical Exam Updated Vital Signs BP (!) 143/92 (BP Location: Right Arm)   Pulse 77   Temp 98.2 F (36.8 C) (Oral)   Resp 14   Ht 5\' 6"  (1.676 m)   Wt 73.5 kg   LMP 07/02/2015   SpO2 98%   BMI 26.15 kg/m   Physical Exam  Constitutional: She is oriented to person, place, and time. She appears well-developed and well-nourished.  Non-toxic appearance.  HENT:  Head: Normocephalic.  Right Ear: Tympanic membrane and external ear normal.  Left Ear: Tympanic membrane and external ear normal.  Mouth/Throat: Uvula is midline. Dental caries present.    There is a cavity noted of the left upper first and second molar.  There is swelling of the gum.  There is no visible abscess appreciated.  The airway is patent.  The uvula is in the midline.  There is no swelling under the tongue.  Eyes: Pupils are  equal, round, and reactive to light. EOM and lids are normal.  Neck: Normal range of motion. Neck supple. Carotid bruit is not present.  Cardiovascular: Normal rate, regular rhythm, normal heart sounds, intact distal pulses and normal pulses.  Pulmonary/Chest: Breath sounds normal. No respiratory distress.  Abdominal: Soft. Bowel sounds are normal. There is no tenderness. There is no guarding.  Musculoskeletal: Normal range of motion.  Lymphadenopathy:       Head (right side): No submandibular adenopathy present.       Head (left side): No submandibular adenopathy present.     She has no cervical adenopathy.  Neurological: She is alert and oriented to person, place, and time. She has normal strength. No cranial nerve deficit or sensory deficit.  Skin: Skin is warm and dry.  Psychiatric: She has a normal mood and affect. Her speech is normal.  Nursing note and vitals reviewed.    ED Treatments / Results  Labs (all labs ordered are listed, but only abnormal results are displayed) Labs Reviewed - No data to display  EKG None  Radiology No results found.  Procedures Procedures (including critical care time)  Medications Ordered in ED Medications - No data to display   Initial Impression / Assessment and Plan / ED Course  I have reviewed the triage vital signs and the nursing notes.  Pertinent labs & imaging results that were available during my care of the patient were reviewed by me and considered in my medical decision making (see chart for details).      Final Clinical Impressions(s) / ED Diagnoses MDM  Vital signs reviewed.  Patient has dental caries of the left upper molar areas.  There is no evidence for Ludewig's angina or other emergent changes.  Patient has no problem mobilizing secretions, and no significant problems opening and closing her mouth.  Patient will be treated with Amoxil, ibuprofen, and 10 tablets of Ultram.  I have given the patient resource information and advised her to see a dentist as soon as possible.  Patient is in agreement with this plan.   Final diagnoses:  Dental caries  Pain, dental    ED Discharge Orders         Ordered    amoxicillin (AMOXIL) 500 MG capsule  3 times daily     07/12/18 2258    ibuprofen (ADVIL,MOTRIN) 600 MG tablet  4 times daily     07/12/18 2258    traMADol (ULTRAM) 50 MG tablet     07/12/18 2258           Ivery Quale, PA-C 07/12/18 2342    Donnetta Hutching, MD 07/15/18 [phone]

## 2018-07-12 NOTE — ED Triage Notes (Signed)
Pt c/o pain to upper right jaw tooth x 1 wk, has been taking ibuprofen for pain

## 2018-12-17 ENCOUNTER — Encounter (HOSPITAL_COMMUNITY): Payer: Self-pay | Admitting: Emergency Medicine

## 2018-12-17 ENCOUNTER — Emergency Department (HOSPITAL_COMMUNITY)
Admission: EM | Admit: 2018-12-17 | Discharge: 2018-12-17 | Disposition: A | Discharge status: Home / Self Care | Payer: Self-pay | Attending: Emergency Medicine | Admitting: Emergency Medicine

## 2018-12-17 ENCOUNTER — Other Ambulatory Visit: Payer: Self-pay

## 2018-12-17 ENCOUNTER — Emergency Department (HOSPITAL_COMMUNITY): Payer: Self-pay

## 2018-12-17 DIAGNOSIS — H6501 Acute serous otitis media, right ear: Secondary | ICD-10-CM | POA: Insufficient documentation

## 2018-12-17 DIAGNOSIS — J029 Acute pharyngitis, unspecified: Secondary | ICD-10-CM | POA: Insufficient documentation

## 2018-12-17 DIAGNOSIS — H6500 Acute serous otitis media, unspecified ear: Secondary | ICD-10-CM

## 2018-12-17 LAB — CBC
HCT: 43.5 % (ref 36.0–46.0)
Hemoglobin: 13.3 g/dL (ref 12.0–15.0)
MCH: 26.4 pg (ref 26.0–34.0)
MCHC: 30.6 g/dL (ref 30.0–36.0)
MCV: 86.5 fL (ref 80.0–100.0)
Platelets: 206 10*3/uL (ref 150–400)
RBC: 5.03 MIL/uL (ref 3.87–5.11)
RDW: 13.6 % (ref 11.5–15.5)
WBC: 3.2 10*3/uL — ABNORMAL LOW (ref 4.0–10.5)
nRBC: 0 % (ref 0.0–0.2)

## 2018-12-17 LAB — BASIC METABOLIC PANEL
Anion gap: 11 (ref 5–15)
BUN: 9 mg/dL (ref 6–20)
CO2: 25 mmol/L (ref 22–32)
Calcium: 8.4 mg/dL — ABNORMAL LOW (ref 8.9–10.3)
Chloride: 101 mmol/L (ref 98–111)
Creatinine, Ser: 0.85 mg/dL (ref 0.44–1.00)
GFR calc Af Amer: >60 mL/min (ref >60)
GFR calc non Af Amer: >60 mL/min (ref >60)
Glucose, Bld: 109 mg/dL — ABNORMAL HIGH (ref 70–99)
Potassium: 3.3 mmol/L — ABNORMAL LOW (ref 3.5–5.1)
Sodium: 137 mmol/L (ref 135–145)

## 2018-12-17 MED ORDER — SODIUM CHLORIDE 0.9 % IV BOLUS
1000.0000 mL | Freq: Once | INTRAVENOUS | Status: AC
  Administered 2018-12-17: 1000 mL via INTRAVENOUS

## 2018-12-17 MED ORDER — SODIUM CHLORIDE 0.9% FLUSH
3.0000 mL | Freq: Once | INTRAVENOUS | Status: AC
  Administered 2018-12-17: 3 mL via INTRAVENOUS

## 2018-12-17 MED ORDER — KETOROLAC TROMETHAMINE 30 MG/ML IJ SOLN
30.0000 mg | Freq: Once | INTRAMUSCULAR | Status: AC
  Administered 2018-12-17: 30 mg via INTRAVENOUS
  Filled 2018-12-17: qty 1

## 2018-12-17 MED ORDER — CEPHALEXIN 500 MG PO CAPS: 500.0000 mg | ORAL_CAPSULE | Freq: Four times a day (QID) | ORAL | 0 refills | Status: DC

## 2018-12-17 NOTE — ED Triage Notes (Signed)
Pt c/o headache and mid back pain x 1 day. bialteral ear pain/sorethroat.

## 2018-12-17 NOTE — ED Provider Notes (Signed)
[facility]ENCY [facility] Provider Note   CSN: [id number] Arrival date & time: 12/17/18  2015     History   Chief Complaint Chief Complaint  Patient presents with  . Headache  . Back Pain    HPI Kathryn Fletcher is a 53 y.o. female.  Patient complains of sore throat and right earache with some backaches.  The history is provided by the patient. No language interpreter was used.  Headache  Pain location:  Generalized Quality:  Dull Radiates to:  Does not radiate Severity currently:  3/10 Severity at highest:  5/10 Onset quality:  Gradual Duration:  10 hours Timing:  Constant Progression:  Waxing and waning Chronicity:  New Similar to prior headaches: no   Context: not activity   Relieved by:  Nothing Associated symptoms: back pain   Associated symptoms: no abdominal pain, no congestion, no cough, no diarrhea, no fatigue, no seizures and no sinus pressure   Back Pain  Associated symptoms: headaches   Associated symptoms: no abdominal pain and no chest pain     History reviewed. No pertinent past medical history.  There are no active problems to display for this patient.   History reviewed. No pertinent surgical history.   OB History   No obstetric history on file.      Home Medications    Prior to Admission medications   Medication Sig Start Date End Date Taking? Authorizing Provider  ibuprofen (ADVIL,MOTRIN) 200 MG tablet Take 800 mg by mouth every 6 (six) hours as needed for mild pain or moderate pain.   Yes [provider]  cephALEXin (KEFLEX) 500 MG capsule Take 1 capsule (500 mg total) by mouth 4 (four) times daily. 12/17/18   Bethann BerkshireZammit, Emilee Market, MD    Family History History reviewed. No pertinent family history.  Social History Social History   Tobacco Use  . Smoking status: Never Smoker  . Smokeless tobacco: Never Used  Substance Use Topics  . Alcohol use: No  . Drug use: No     Allergies   Patient has no known  allergies.   Review of Systems Review of Systems  Constitutional: Negative for appetite change and fatigue.  HENT: Negative for congestion, ear discharge and sinus pressure.        Sore throat and earache  Eyes: Negative for discharge.  Respiratory: Negative for cough.   Cardiovascular: Negative for chest pain.  Gastrointestinal: Negative for abdominal pain and diarrhea.  Genitourinary: Negative for frequency and hematuria.  Musculoskeletal: Positive for back pain.  Skin: Negative for rash.  Neurological: Positive for headaches. Negative for seizures.  Psychiatric/Behavioral: Negative for hallucinations.     Physical Exam Updated Vital Signs BP 103/80   Pulse 97   Temp 99.4 F (37.4 C) (Oral)   Resp 20   LMP 07/02/2015   SpO2 93%   Physical Exam Vitals signs and nursing note reviewed.  Constitutional:      Appearance: She is well-developed.  HENT:     Head: Normocephalic.     Ears:     Comments: Right TM inflamed    Nose: Nose normal.     Mouth/Throat:     Comments: Pharynx mildly inflamed Eyes:     General: No scleral icterus.    Conjunctiva/sclera: Conjunctivae normal.  Neck:     Musculoskeletal: Neck supple.     Thyroid: No thyromegaly.  Cardiovascular:     Rate and Rhythm: Normal rate and regular rhythm.     Heart sounds: No murmur.  No friction rub. No gallop.   Pulmonary:     Breath sounds: No stridor. No wheezing or rales.  Chest:     Chest wall: No tenderness.  Abdominal:     General: There is no distension.     Tenderness: There is no abdominal tenderness. There is no rebound.  Musculoskeletal: Normal range of motion.  Lymphadenopathy:     Cervical: No cervical adenopathy.  Skin:    Findings: No erythema or rash.  Neurological:     Mental Status: She is oriented to person, place, and time.     Motor: No abnormal muscle tone.     Coordination: Coordination normal.  Psychiatric:        Behavior: Behavior normal.      ED Treatments /  Results  Labs (all labs ordered are listed, but only abnormal results are displayed) Labs Reviewed  BASIC METABOLIC PANEL - Abnormal; Notable for the following components:      Result Value   Potassium 3.3 (*)    Glucose, Bld 109 (*)    Calcium 8.4 (*)    All other components within normal limits  CBC - Abnormal; Notable for the following components:   WBC 3.2 (*)    All other components within normal limits    EKG EKG Interpretation  Date/Time:  Thursday December 17 2018 20:37:17 EST Ventricular Rate:  104 PR Interval:    QRS Duration: 89 QT Interval:  348 QTC Calculation: 458 R Axis:   76 Text Interpretation:  Sinus tachycardia Multiple ventricular premature complexes Right atrial enlargement Consider left ventricular hypertrophy Baseline wander in lead(s) V5 V6 Confirmed by Bethann Berkshire [phone]) on 12/17/2018 10:25:00 PM   Radiology Dg Chest 2 View  Result Date: 12/17/2018 CLINICAL DATA:  Three-day history of productive cough, headache, earache and sore throat. EXAM: CHEST - 2 VIEW COMPARISON:  None. FINDINGS: Cardiomediastinal silhouette unremarkable. Lungs clear. Bronchovascular markings normal. Pulmonary vascularity normal. No visible pleural effusions. No pneumothorax. Nipple shadow projects over the LEFT lung base on the PA image. Degenerative changes involving the thoracic spine. IMPRESSION: No acute cardiopulmonary disease. Electronically Signed   By: Hulan Saas M.D.   On: 12/17/2018 21:10    Procedures Procedures (including critical care time)  Medications Ordered in ED Medications  sodium chloride flush (NS) 0.9 % injection 3 mL (3 mLs Intravenous Given 12/17/18 2042)  ketorolac (TORADOL) 30 MG/ML injection 30 mg (30 mg Intravenous Given 12/17/18 2052)  sodium chloride 0.9 % bolus 1,000 mL (0 mLs Intravenous Stopped 12/17/18 2142)     Initial Impression / Assessment and Plan / ED Course  I have reviewed the triage vital signs and the nursing  notes.  Pertinent labs & imaging results that were available during my care of the patient were reviewed by me and considered in my medical decision making (see chart for details).     Patient with otitis media and pharyngitis she will be placed on Keflex.  She will follow-up with her PCP if needed  Final Clinical Impressions(s) / ED Diagnoses   Final diagnoses:  Acute serous otitis media, recurrence not specified, unspecified laterality    ED Discharge Orders         Ordered    cephALEXin (KEFLEX) 500 MG capsule  4 times daily     12/17/18 2238           Bethann Berkshire, MD 12/17/18 2241

## 2018-12-17 NOTE — Discharge Instructions (Addendum)
Take Tylenol or Motrin for pain.  Drink plenty of fluids and follow-up next week if not improved

## 2018-12-17 NOTE — ED Notes (Signed)
Pt denies any cp but hr very irregular.

## 2019-09-22 IMAGING — DX DG CHEST 2V
2 series · 2 of 2 positions shown · non-contrast
Comparison: None.

CLINICAL DATA: Three-day history of productive cough, headache,
earache and sore throat.

EXAM:
CHEST - 2 VIEW

[chest pa]
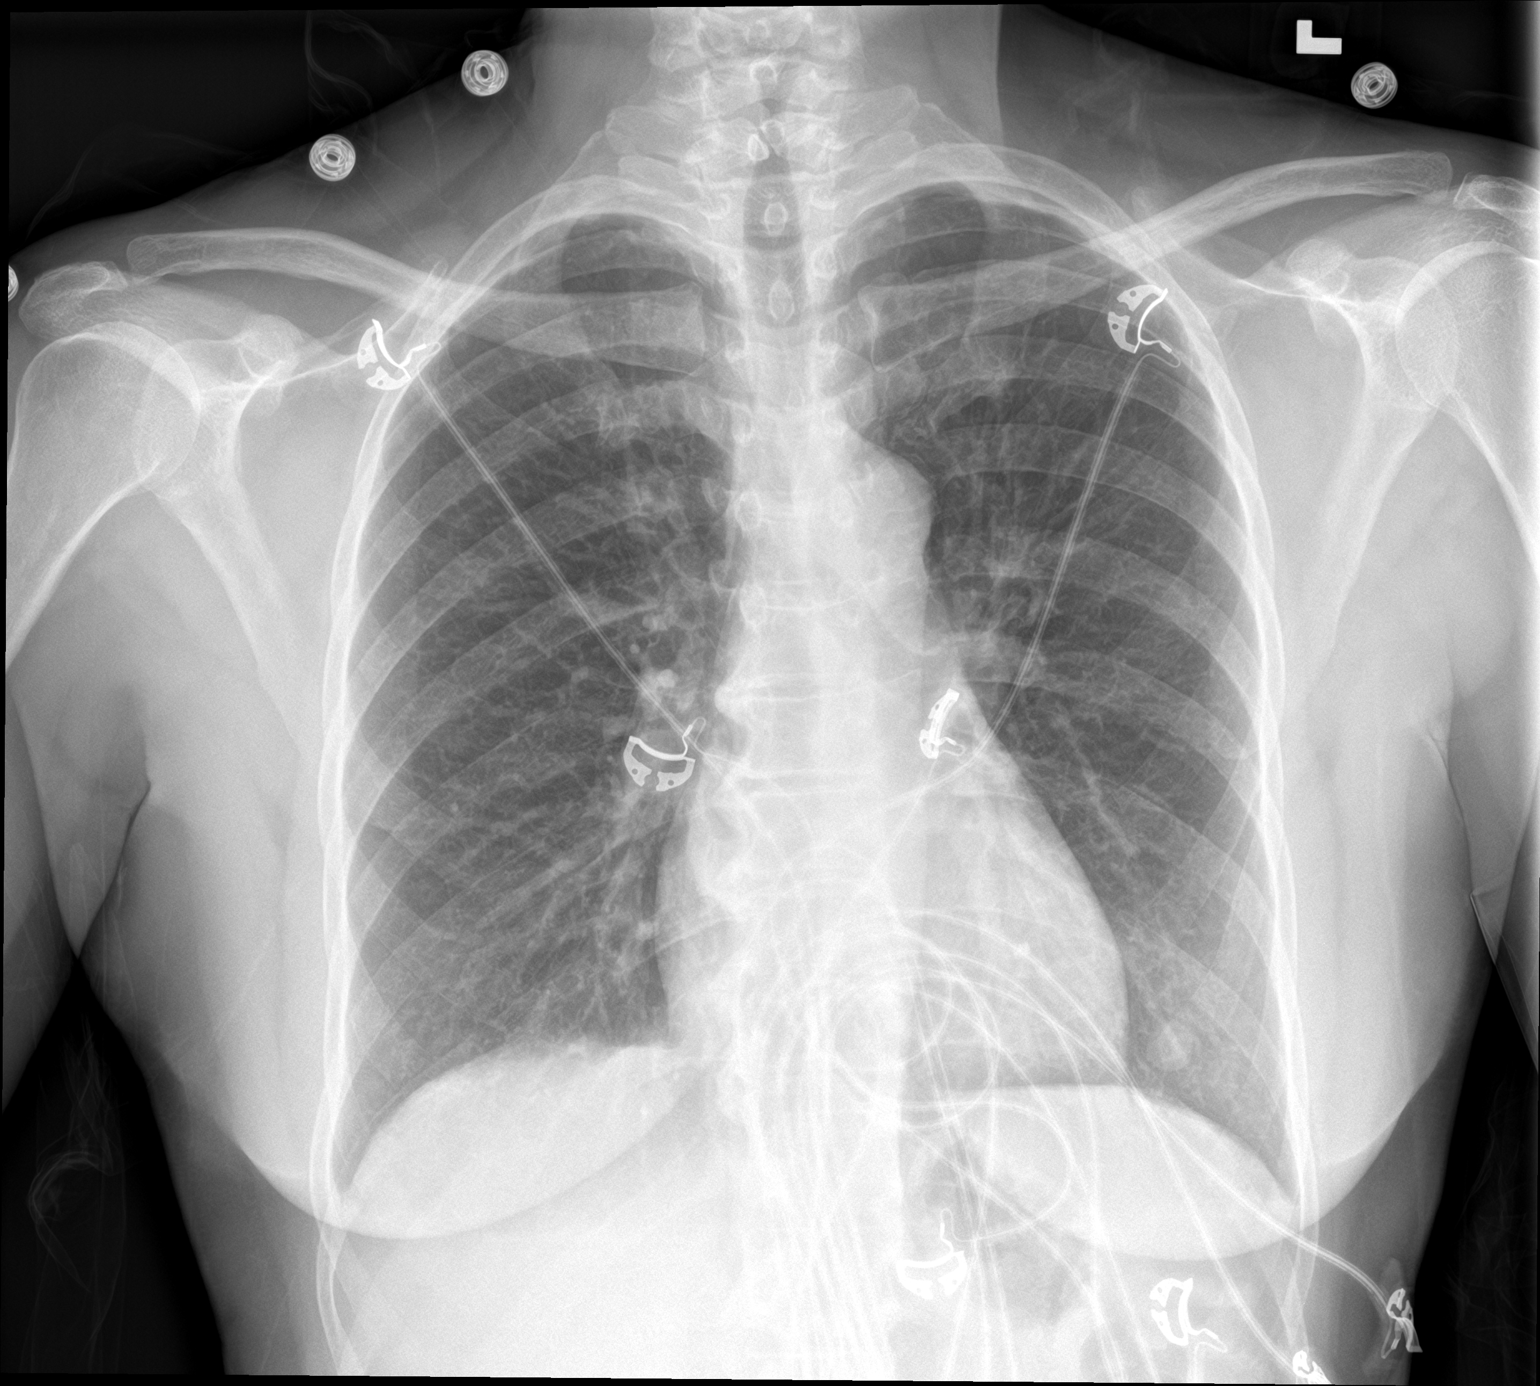

[chest lat]
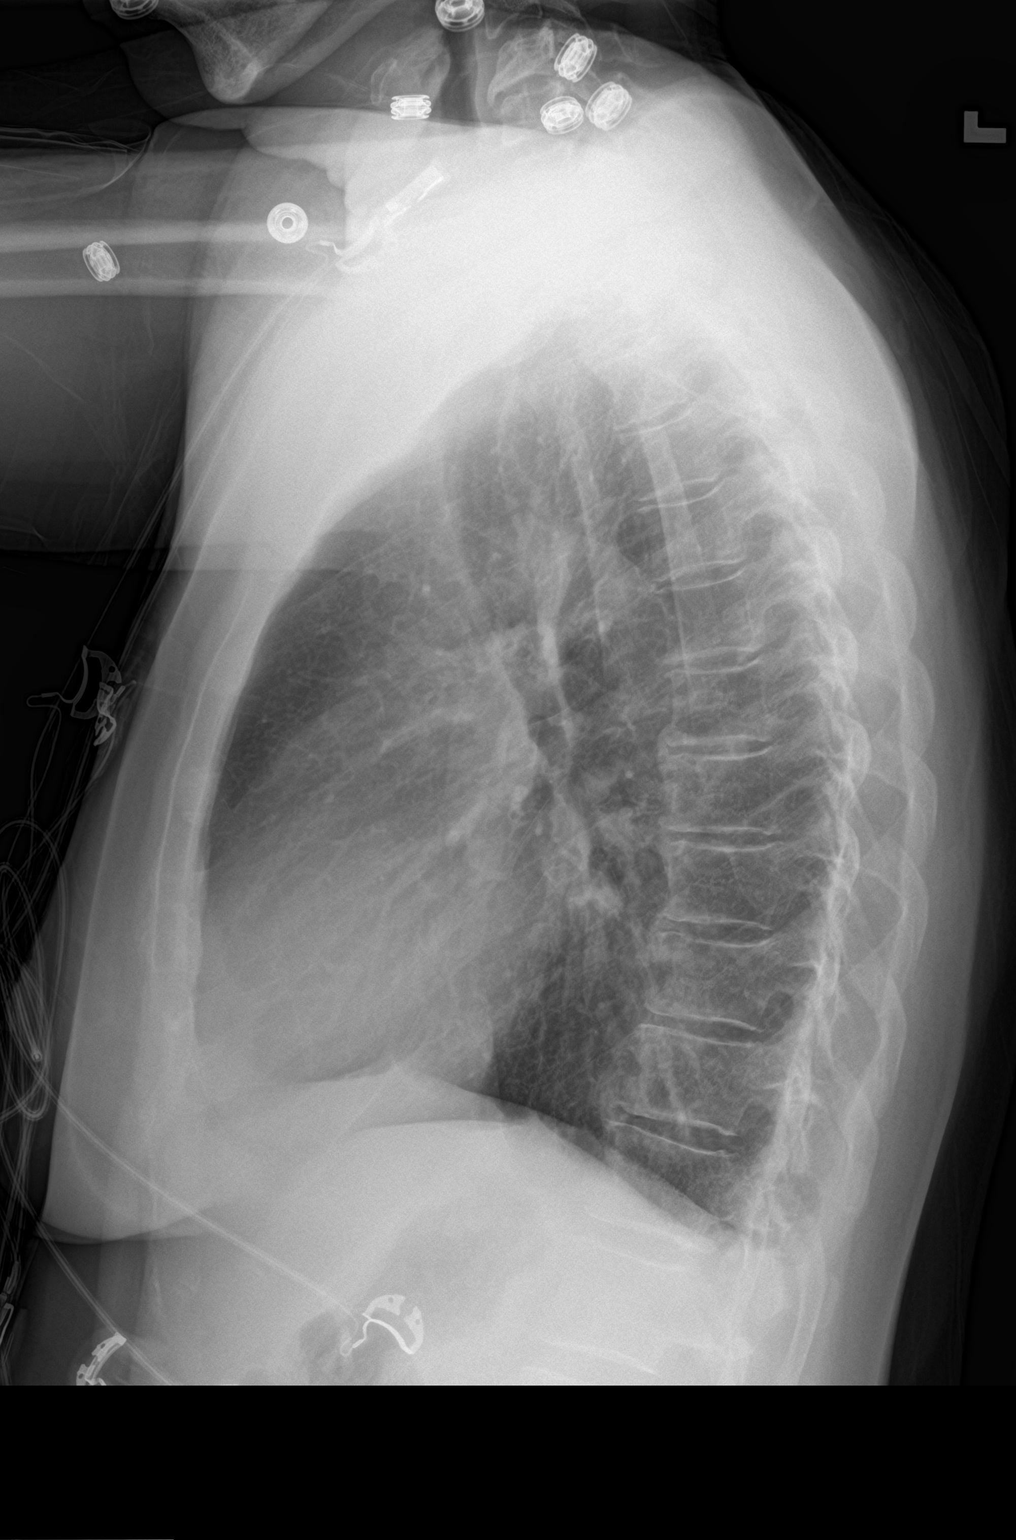

[2 of 2 positions shown; findings below may reference images not displayed]

FINDINGS: Cardiomediastinal silhouette unremarkable. Lungs clear.
Bronchovascular markings normal. Pulmonary vascularity normal. No
visible pleural effusions. No pneumothorax. Nipple shadow projects
over the LEFT lung base on the PA image. Degenerative changes
involving the thoracic spine.
IMPRESSION: No acute cardiopulmonary disease.

## 2019-12-17 ENCOUNTER — Ambulatory Visit: Payer: Self-pay | Attending: Internal Medicine

## 2019-12-17 ENCOUNTER — Other Ambulatory Visit: Payer: Self-pay

## 2019-12-17 DIAGNOSIS — Z20822 Contact with and (suspected) exposure to covid-19: Secondary | ICD-10-CM

## 2019-12-17 DIAGNOSIS — U071 COVID-19: Secondary | ICD-10-CM | POA: Insufficient documentation

## 2019-12-18 LAB — NOVEL CORONAVIRUS, NAA: SARS-CoV-2, NAA: DETECTED — AB

## 2020-05-17 ENCOUNTER — Ambulatory Visit
Admission: EM | Admit: 2020-05-17 | Discharge: 2020-05-17 | Disposition: A | Discharge status: Home / Self Care | Payer: Self-pay | Attending: Family Medicine | Admitting: Family Medicine

## 2020-05-17 DIAGNOSIS — M5442 Lumbago with sciatica, left side: Secondary | ICD-10-CM

## 2020-05-17 MED ORDER — PREDNISONE 10 MG (21) PO TBPK: ORAL_TABLET | Freq: Every day | ORAL | 0 refills | Status: AC

## 2020-05-17 MED ORDER — CYCLOBENZAPRINE HCL 10 MG PO TABS: 10.0000 mg | ORAL_TABLET | Freq: Two times a day (BID) | ORAL | 0 refills | Status: DC | PRN

## 2020-05-17 MED ORDER — IBUPROFEN 800 MG PO TABS: 800.0000 mg | ORAL_TABLET | Freq: Three times a day (TID) | ORAL | 0 refills | Status: DC | PRN

## 2020-05-17 NOTE — ED Provider Notes (Signed)
[facility]   [id number] 05/17/20 Arrival Time: 1606  CX:KGYJE PAIN  SUBJECTIVE: History from: patient. Kathryn Fletcher is a 54 y.o. female complains of left and bilateral low back pain that began a few months ago. Reports that it has gotten worse over the last few days. Reports that she does a lot of lifting at work. Denies a single precipitating event or specific injury.  Describes the pain as intermittent and achy in character. Has tried OTC medications without relief. Symptoms are made worse with activity. Reports that pain radiates to L hip and leg at times. Denies similar symptoms in the past. Denies fever, chills, erythema, ecchymosis, effusion, weakness, numbness and tingling, saddle paresthesias, loss of bowel or bladder function.      ROS: As per HPI.  All other pertinent ROS negative.     History reviewed. No pertinent past medical history. History reviewed. No pertinent surgical history. No Known Allergies No current facility-administered medications on file prior to encounter.   Current Outpatient Medications on File Prior to Encounter  Medication Sig Dispense Refill  . cephALEXin (KEFLEX) 500 MG capsule Take 1 capsule (500 mg total) by mouth 4 (four) times daily. 28 capsule 0   Social History   Socioeconomic History  . Marital status: Single    Spouse name: Not on file  . Number of children: Not on file  . Years of education: Not on file  . Highest education level: Not on file  Occupational History  . Not on file  Tobacco Use  . Smoking status: Never Smoker  . Smokeless tobacco: Never Used  Vaping Use  . Vaping Use: Never used  Substance and Sexual Activity  . Alcohol use: No  . Drug use: No  . Sexual activity: Not on file  Other Topics Concern  . Not on file  Social History Narrative  . Not on file   Social Determinants of Health   Financial Resource Strain:   . Difficulty of Paying Living Expenses:   Food Insecurity:   . Worried About  Programme researcher, broadcasting/film/video in the Last Year:   . Barista in the Last Year:   Transportation Needs:   . Freight forwarder (Medical):   Marland Kitchen Lack of Transportation (Non-Medical):   Physical Activity:   . Days of Exercise per Week:   . Minutes of Exercise per Session:   Stress:   . Feeling of Stress :   Social Connections:   . Frequency of Communication with Friends and Family:   . Frequency of Social Gatherings with Friends and Family:   . Attends Religious Services:   . Active Member of Clubs or Organizations:   . Attends Banker Meetings:   Marland Kitchen Marital Status:   Intimate Partner Violence:   . Fear of Current or Ex-Partner:   . Emotionally Abused:   Marland Kitchen Physically Abused:   . Sexually Abused:    Family History  Problem Relation Age of Onset  . Alzheimer's disease Father     OBJECTIVE:  Vitals:   05/17/20 1616  BP: (!) 171/83  Pulse: 86  Resp: 20  Temp: 98.1 F (36.7 C)  SpO2: 98%    General appearance: ALERT; in no acute distress.  Head: NCAT Lungs: Normal respiratory effort CV: pedal pulses 2+ bilaterally. Cap refill < 2 seconds Musculoskeletal:  Inspection: Skin warm, dry, clear and intact without obvious erythema, effusion, or ecchymosis.  Palpation: Nontender to palpation ROM: FROM active and passive Skin:  warm and dry Neurologic: Ambulates without difficulty; Sensation intact about the upper/ lower extremities Psychological: alert and cooperative; normal mood and affect  DIAGNOSTIC STUDIES:  No results found.   ASSESSMENT & PLAN:  1. Acute left-sided low back pain with left-sided sciatica       Meds ordered this encounter  Medications  . ibuprofen (ADVIL) 800 MG tablet    Sig: Take 1 tablet (800 mg total) by mouth every 8 (eight) hours as needed for moderate pain.    Dispense:  21 tablet    Refill:  0    Order Specific Question:   Supervising Provider    Answer:   Chase Picket [id number]  . cyclobenzaprine (FLEXERIL) 10 MG  tablet    Sig: Take 1 tablet (10 mg total) by mouth 2 (two) times daily as needed for muscle spasms.    Dispense:  20 tablet    Refill:  0    Order Specific Question:   Supervising Provider    Answer:   Chase Picket [id number]  . predniSONE (STERAPRED UNI-PAK 21 TAB) 10 MG (21) TBPK tablet    Sig: Take by mouth daily for 6 days. Take 6 tablets on day 1, 5 tablets on day 2, 4 tablets on day 3, 3 tablets on day 4, 2 tablets on day 5, 1 tablet on day 6    Dispense:  21 tablet    Refill:  0    Order Specific Question:   Supervising Provider    Answer:   Chase Picket [[id number]]    Acute Left sided low back pain with sciatica  Prescribed prednisone Prescribed ibuprofen Prescribed flexeril Continue conservative management of rest, ice, and gentle stretches Take ibuprofen as needed for pain relief (may cause abdominal discomfort, ulcers, and GI bleeds avoid taking with other NSAIDs) Take cyclobenzaprine at nighttime for symptomatic relief. Avoid driving or operating heavy machinery while using medication. Follow up with PCP if symptoms persist Return or go to the ER if you have any new or worsening symptoms (fever, chills, chest pain, abdominal pain, changes in bowel or bladder habits, pain radiating into lower legs)   Reviewed expectations re: course of current medical issues. Questions answered. Outlined signs and symptoms indicating need for more acute intervention. Patient verbalized understanding. After Visit Summary given.       Faustino Congress, NP 05/17/20 [phone]

## 2020-05-17 NOTE — ED Triage Notes (Signed)
Pt presents with c/o low back pain that radiates to left buttock. Denies injury . Performs a lot of lifting at work

## 2020-05-17 NOTE — Discharge Instructions (Addendum)
Take the prescribed ibuprofen as needed for your pain.  Take the muscle relaxer Flexeril as needed for muscle spasm; do not drive, operate machinery, or drink alcohol with this medication as it may make you drowsy.    Follow up with an orthopedist if your pain is not improving.  Go to the emergency department if you have worsening pain or develop new symptoms such as difficulty with urination, weakness, numbness, loss of control of your bladder or bowels, fever, chills or other concerns.  

## 2021-12-24 ENCOUNTER — Other Ambulatory Visit: Payer: Self-pay

## 2021-12-24 ENCOUNTER — Encounter: Payer: Self-pay | Admitting: Emergency Medicine

## 2021-12-24 ENCOUNTER — Ambulatory Visit
Admission: EM | Admit: 2021-12-24 | Discharge: 2021-12-24 | Disposition: A | Discharge status: Home / Self Care | Payer: Self-pay

## 2021-12-24 DIAGNOSIS — J029 Acute pharyngitis, unspecified: Secondary | ICD-10-CM

## 2021-12-24 DIAGNOSIS — Z1152 Encounter for screening for COVID-19: Secondary | ICD-10-CM

## 2021-12-24 DIAGNOSIS — J069 Acute upper respiratory infection, unspecified: Secondary | ICD-10-CM

## 2021-12-24 LAB — POCT RAPID STREP A (OFFICE): Rapid Strep A Screen: NEGATIVE

## 2021-12-24 MED ORDER — PROMETHAZINE-DM 6.25-15 MG/5ML PO SYRP: 5.0000 mL | ORAL_SOLUTION | Freq: Four times a day (QID) | ORAL | 0 refills | Status: DC | PRN

## 2021-12-24 NOTE — ED Provider Notes (Signed)
[facility]    CSN: [id number] Arrival date & time: 12/24/21  0917      History   Chief Complaint Chief Complaint  Patient presents with   Headache   Sore Throat   Otalgia    HPI Kathryn Fletcher is a 56 y.o. female.   Presenting today with 2-day history of headache, sore throat, bilateral ear pain, cough, fatigue.  Denies fever, chills, body aches, chest pain, shortness of breath, abdominal pain, nausea vomiting or diarrhea.  Taking Tylenol PM, Mucinex with minimal relief.  No known sick contacts recently.  No known pertinent chronic medical problems per patient.   History reviewed. No pertinent past medical history.  There are no problems to display for this patient.   History reviewed. No pertinent surgical history.  OB History   No obstetric history on file.      Home Medications    Prior to Admission medications   Medication Sig Start Date End Date Taking? Authorizing Provider  diphenhydramine-acetaminophen (TYLENOL PM) 25-500 MG TABS tablet Take 1 tablet by mouth at bedtime as needed.   Yes [provider]  promethazine-dextromethorphan (PROMETHAZINE-DM) 6.25-15 MG/5ML syrup Take 5 mLs by mouth 4 (four) times daily as needed. 12/24/21  Yes Particia Nearing, PA-C  cephALEXin (KEFLEX) 500 MG capsule Take 1 capsule (500 mg total) by mouth 4 (four) times daily. 12/17/18   Bethann Berkshire, MD  cyclobenzaprine (FLEXERIL) 10 MG tablet Take 1 tablet (10 mg total) by mouth 2 (two) times daily as needed for muscle spasms. 05/17/20   Moshe Cipro, NP  ibuprofen (ADVIL) 800 MG tablet Take 1 tablet (800 mg total) by mouth every 8 (eight) hours as needed for moderate pain. 05/17/20   Moshe Cipro, NP    Family History Family History  Problem Relation Age of Onset   Alzheimer's disease Father     Social History Social History   Tobacco Use   Smoking status: Never   Smokeless tobacco: Never  Vaping Use   Vaping Use: Never used   Substance Use Topics   Alcohol use: No   Drug use: No     Allergies   Patient has no known allergies.   Review of Systems Review of Systems Per HPI  Physical Exam Triage Vital Signs ED Triage Vitals  Enc Vitals Group     BP 12/24/21 0942 (!) 161/80     Pulse Rate 12/24/21 0942 (!) 102     Resp 12/24/21 0942 16     Temp 12/24/21 0942 99.4 F (37.4 C)     Temp Source 12/24/21 0942 Oral     SpO2 12/24/21 0942 95 %     Weight --      Height --      Head Circumference --      Peak Flow --      Pain Score 12/24/21 0943 6     Pain Loc --      Pain Edu? --      Excl. in GC? --    No data found.  Updated Vital Signs BP (!) 161/80 (BP Location: Right Arm)    Pulse (!) 102    Temp 99.4 F (37.4 C) (Oral)    Resp 16    LMP 07/02/2015    SpO2 95%   Visual Acuity Right Eye Distance:   Left Eye Distance:   Bilateral Distance:    Right Eye Near:   Left Eye Near:    Bilateral Near:  Physical Exam Vitals and nursing note reviewed.  Constitutional:      Appearance: Normal appearance.  HENT:     Head: Atraumatic.     Right Ear: Tympanic membrane and external ear normal.     Left Ear: Tympanic membrane and external ear normal.     Nose: Congestion present.     Mouth/Throat:     Mouth: Mucous membranes are moist.     Pharynx: Posterior oropharyngeal erythema present. No oropharyngeal exudate.     Comments: Moderate bilateral tonsillar edema, uvula midline, oral airway patent Eyes:     Extraocular Movements: Extraocular movements intact.     Conjunctiva/sclera: Conjunctivae normal.  Cardiovascular:     Rate and Rhythm: Normal rate and regular rhythm.     Heart sounds: Normal heart sounds.  Pulmonary:     Effort: Pulmonary effort is normal.     Breath sounds: Normal breath sounds. No wheezing or rales.  Musculoskeletal:        General: Normal range of motion.     Cervical back: Normal range of motion and neck supple.  Skin:    General: Skin is warm and dry.   Neurological:     Mental Status: She is alert and oriented to person, place, and time.  Psychiatric:        Mood and Affect: Mood normal.        Thought Content: Thought content normal.     UC Treatments / Results  Labs (all labs ordered are listed, but only abnormal results are displayed) Labs Reviewed  NOVEL CORONAVIRUS, NAA  CULTURE, GROUP A STREP [facility])  POCT RAPID STREP A (OFFICE)    EKG   Radiology No results found.  Procedures Procedures (including critical care time)  Medications Ordered in UC Medications - No data to display  Initial Impression / Assessment and Plan / UC Course  I have reviewed the triage vital signs and the nursing notes.  Pertinent labs & imaging results that were available during my care of the patient were reviewed by me and considered in my medical decision making (see chart for details).     Mildly tachycardic and hypertensive in triage, otherwise vital signs reassuring.  Suspect viral upper respiratory infection, rapid strep negative, COVID test pending.  Declines flu testing today.  We will treat symptomatically with Phenergan DM, over-the-counter cold congestion medications and pain relievers as needed.  Supportive home care return precautions reviewed.  Work note given.  Final Clinical Impressions(s) / UC Diagnoses   Final diagnoses:  Sore throat  Viral URI with cough   Discharge Instructions   None    ED Prescriptions     Medication Sig Dispense Auth. Provider   promethazine-dextromethorphan (PROMETHAZINE-DM) 6.25-15 MG/5ML syrup Take 5 mLs by mouth 4 (four) times daily as needed. 100 mL Particia Nearing, [place]      PDMP not reviewed this encounter.   Particia Nearing, [place] 12/24/21 1048

## 2021-12-24 NOTE — ED Triage Notes (Signed)
Patient c/o headache, sore throat, and bilateral ear pain x 2 days.   Patient denies fever at home. Patient denies fatigue or generalized body aches.   Patient has taken Tylenol PM with no relief of symptoms.

## 2021-12-25 LAB — NOVEL CORONAVIRUS, NAA: SARS-CoV-2, NAA: NOT DETECTED

## 2021-12-25 LAB — SARS-COV-2, NAA 2 DAY TAT

## 2021-12-27 LAB — CULTURE, GROUP A STREP (THRC)

## 2024-03-20 ENCOUNTER — Ambulatory Visit
Admission: EM | Admit: 2024-03-20 | Discharge: 2024-03-20 | Disposition: A | Discharge status: Home / Self Care | Payer: Self-pay | Attending: Family Medicine | Admitting: Family Medicine

## 2024-03-20 DIAGNOSIS — H6123 Impacted cerumen, bilateral: Secondary | ICD-10-CM

## 2024-03-20 MED ORDER — CARBAMIDE PEROXIDE 6.5 % OT SOLN: 5.0000 [drp] | Freq: Two times a day (BID) | OTIC | 0 refills | Status: AC | PRN

## 2024-03-20 NOTE — ED Triage Notes (Signed)
 Pt reports both of her ears are stopped up x 1 week

## 2024-03-20 NOTE — ED Provider Notes (Signed)
 [facility]    CSN: [id number] Arrival date & time: 03/20/24  7829      History   Chief Complaint No chief complaint on file.   HPI Kathryn Fletcher is a 58 y.o. female.   Patient presenting today with 1 week history of bilateral ear muffled hearing, pressure.  Denies drainage, bleeding, headache, congestion.  So far trying peroxide with mild temporary benefit.    History reviewed. No pertinent past medical history.  There are no active problems to display for this patient.   History reviewed. No pertinent surgical history.  OB History   No obstetric history on file.      Home Medications    Prior to Admission medications   Medication Sig Start Date End Date Taking? Authorizing Provider  carbamide peroxide (DEBROX) 6.5 % OTIC solution Place 5 drops into both ears 2 (two) times daily as needed. 03/20/24  Yes Corbin Dess, PA-C  cephALEXin  (KEFLEX ) 500 MG capsule Take 1 capsule (500 mg total) by mouth 4 (four) times daily. 12/17/18   Cheyenne Cotta, MD  cyclobenzaprine  (FLEXERIL ) 10 MG tablet Take 1 tablet (10 mg total) by mouth 2 (two) times daily as needed for muscle spasms. 05/17/20   Wellington Half, FNP  diphenhydramine-acetaminophen  (TYLENOL  PM) 25-500 MG TABS tablet Take 1 tablet by mouth at bedtime as needed.    [provider]  ibuprofen  (ADVIL ) 800 MG tablet Take 1 tablet (800 mg total) by mouth every 8 (eight) hours as needed for moderate pain. 05/17/20   Wellington Half, FNP  promethazine -dextromethorphan (PROMETHAZINE -DM) 6.25-15 MG/5ML syrup Take 5 mLs by mouth 4 (four) times daily as needed. 12/24/21   Corbin Dess, PA-C    Family History Family History  Problem Relation Age of Onset   Alzheimer's disease Father     Social History Social History   Tobacco Use   Smoking status: Never   Smokeless tobacco: Never  Vaping Use   Vaping status: Never Used  Substance Use Topics   Alcohol use: No   Drug  use: No     Allergies   Patient has no known allergies.   Review of Systems Review of Systems Per HPI  Physical Exam Triage Vital Signs ED Triage Vitals [03/20/24 0953]  Encounter Vitals Group     BP (!) 152/81     Systolic BP Percentile      Diastolic BP Percentile      Pulse Rate 78     Resp 18     Temp 98.1 F (36.7 C)     Temp Source Oral     SpO2 98 %     Weight      Height      Head Circumference      Peak Flow      Pain Score 0     Pain Loc      Pain Education      Exclude from Growth Chart    No data found.  Updated Vital Signs BP (!) 152/81 (BP Location: Right Arm)   Pulse 78   Temp 98.1 F (36.7 C) (Oral)   Resp 18   LMP 07/02/2015   SpO2 98%   Visual Acuity Right Eye Distance:   Left Eye Distance:   Bilateral Distance:    Right Eye Near:   Left Eye Near:    Bilateral Near:     Physical Exam Vitals and nursing note reviewed.  Constitutional:      Appearance:  Normal appearance. She is not ill-appearing.  HENT:     Head: Atraumatic.     Right Ear: There is impacted cerumen.     Left Ear: There is impacted cerumen.     Mouth/Throat:     Mouth: Mucous membranes are moist.  Eyes:     Extraocular Movements: Extraocular movements intact.     Conjunctiva/sclera: Conjunctivae normal.  Cardiovascular:     Rate and Rhythm: Normal rate.  Pulmonary:     Effort: Pulmonary effort is normal.  Musculoskeletal:        General: Normal range of motion.     Cervical back: Normal range of motion and neck supple.  Skin:    General: Skin is warm and dry.  Neurological:     Mental Status: She is alert and oriented to person, place, and time.  Psychiatric:        Mood and Affect: Mood normal.        Thought Content: Thought content normal.        Judgment: Judgment normal.      UC Treatments / Results  Labs (all labs ordered are listed, but only abnormal results are displayed) Labs Reviewed - No data to display  EKG   Radiology No results  found.  Procedures Procedures (including critical care time)  Medications Ordered in UC Medications - No data to display  Initial Impression / Assessment and Plan / UC Course  I have reviewed the triage vital signs and the nursing notes.  Pertinent labs & imaging results that were available during my care of the patient were reviewed by me and considered in my medical decision making (see chart for details).     Warm water lavage performed today with peroxide with full clearance of bilateral wax impactions.  Patient tolerated procedure well and TMs benign, visualized postprocedure.  Debrox drops sent for ongoing maintenance and prevention.  Return for worsening symptoms.  Final Clinical Impressions(s) / UC Diagnoses   Final diagnoses:  Bilateral impacted cerumen   Discharge Instructions   None    ED Prescriptions     Medication Sig Dispense Auth. Provider   carbamide peroxide (DEBROX) 6.5 % OTIC solution Place 5 drops into both ears 2 (two) times daily as needed. 15 mL Corbin Dess, [place]      PDMP not reviewed this encounter.   Corbin Dess, [place] 03/20/24 1108

## 2024-03-23 ENCOUNTER — Encounter: Payer: Self-pay | Admitting: Emergency Medicine

## 2024-03-23 ENCOUNTER — Ambulatory Visit
Admission: EM | Admit: 2024-03-23 | Discharge: 2024-03-23 | Disposition: A | Discharge status: Home / Self Care | Payer: Self-pay

## 2024-03-23 DIAGNOSIS — H6992 Unspecified Eustachian tube disorder, left ear: Secondary | ICD-10-CM

## 2024-03-23 NOTE — Discharge Instructions (Signed)
 Start flonase nasal spray in your nose to help with the fluid behind your left ear drum.  Seek care if symptoms do not improve after 1-2 weeks.

## 2024-03-23 NOTE — ED Provider Notes (Signed)
 [facility]    CSN: [id number] Arrival date & time: 03/23/24  1427      History   Chief Complaint No chief complaint on file.   HPI Kathryn Fletcher is a 58 y.o. female.   Patient presents today with ongoing bilateral ear popping.  Reports she was seen Saturday, had her ears rinsed, and sensation of fluid in the ears is not better.  She denies muffled hearing, ear pain, recent cough, congestion, or sore throat.  Has tried with the eardrops that she was prescribed without much improvement.    History reviewed. No pertinent past medical history.  There are no active problems to display for this patient.   History reviewed. No pertinent surgical history.  OB History   No obstetric history on file.      Home Medications    Prior to Admission medications   Medication Sig Start Date End Date Taking? Authorizing Provider  carbamide peroxide (DEBROX) 6.5 % OTIC solution Place 5 drops into both ears 2 (two) times daily as needed. 03/20/24   Corbin Dess, PA-C    Family History Family History  Problem Relation Age of Onset   Alzheimer's disease Father     Social History Social History   Tobacco Use   Smoking status: Never   Smokeless tobacco: Never  Vaping Use   Vaping status: Never Used  Substance Use Topics   Alcohol use: No   Drug use: No     Allergies   Patient has no known allergies.   Review of Systems Review of Systems Per HPI  Physical Exam Triage Vital Signs ED Triage Vitals  Encounter Vitals Group     BP 03/23/24 1436 (!) 152/87     Systolic BP Percentile --      Diastolic BP Percentile --      Pulse Rate 03/23/24 1436 84     Resp 03/23/24 1436 18     Temp 03/23/24 1436 97.6 F (36.4 C)     Temp Source 03/23/24 1436 Oral     SpO2 03/23/24 1436 95 %     Weight --      Height --      Head Circumference --      Peak Flow --      Pain Score 03/23/24 1437 0     Pain Loc --      Pain Education --      Exclude from  Growth Chart --    No data found.  Updated Vital Signs BP (!) 152/87 (BP Location: Right Arm)   Pulse 84   Temp 97.6 F (36.4 C) (Oral)   Resp 18   LMP 07/02/2015   SpO2 95%   Visual Acuity Right Eye Distance:   Left Eye Distance:   Bilateral Distance:    Right Eye Near:   Left Eye Near:    Bilateral Near:     Physical Exam Vitals and nursing note reviewed.  Constitutional:      General: She is not in acute distress.    Appearance: Normal appearance. She is not toxic-appearing.  HENT:     Right Ear: Tympanic membrane, ear canal and external ear normal. No middle ear effusion. There is no impacted cerumen. Tympanic membrane is not injected, erythematous or bulging.     Left Ear: A middle ear effusion is present. There is no impacted cerumen. Tympanic membrane is not injected, erythematous or bulging.     Nose: Nose normal. No congestion or  rhinorrhea.     Mouth/Throat:     Mouth: Mucous membranes are moist.     Pharynx: Oropharynx is clear. No oropharyngeal exudate.  Eyes:     General: No scleral icterus.    Extraocular Movements: Extraocular movements intact.  Cardiovascular:     Rate and Rhythm: Normal rate.  Pulmonary:     Effort: Pulmonary effort is normal. No respiratory distress.  Musculoskeletal:     Cervical back: Normal range of motion.  Lymphadenopathy:     Cervical: No cervical adenopathy.  Skin:    General: Skin is warm and dry.     Capillary Refill: Capillary refill takes less than 2 seconds.     Coloration: Skin is not jaundiced or pale.     Findings: No erythema.  Neurological:     Mental Status: She is alert and oriented to person, place, and time.  Psychiatric:        Behavior: Behavior is cooperative.      UC Treatments / Results  Labs (all labs ordered are listed, but only abnormal results are displayed) Labs Reviewed - No data to display  EKG   Radiology No results found.  Procedures Procedures (including critical care  time)  Medications Ordered in UC Medications - No data to display  Initial Impression / Assessment and Plan / UC Course  I have reviewed the triage vital signs and the nursing notes.  Pertinent labs & imaging results that were available during my care of the patient were reviewed by me and considered in my medical decision making (see chart for details).   Patient is minimally hypertensive in triage, otherwise vital signs are stable.  1. Dysfunction of left eustachian tube Start Flonase nasal spray No signs of infection, TMs are intact bilaterally and no canal pain or swelling ER and return precautions discussed   The patient was given the opportunity to ask questions.  All questions answered to their satisfaction.  The patient is in agreement to this plan.   Final Clinical Impressions(s) / UC Diagnoses   Final diagnoses:  Dysfunction of left eustachian tube     Discharge Instructions      Start flonase nasal spray in your nose to help with the fluid behind your left ear drum.  Seek care if symptoms do not improve after 1-2 weeks.    ED Prescriptions   None    PDMP not reviewed this encounter.   Wilhemena Harbour, NP 03/23/24 1450

## 2024-03-23 NOTE — ED Triage Notes (Addendum)
 States was seen on Saturday and had both ears washed out.  States she continues to having popping noise in both ears.
